# Patient Record
Sex: Female | Born: 1950 | Race: White | Hispanic: No | Marital: Married
Health system: Southern US, Academic
[De-identification: ages and names within clinical notes are randomized; demographics above are authoritative.]

## PROBLEM LIST (undated history)

## (undated) ENCOUNTER — Telehealth: Attending: Hematology & Oncology | Primary: Hematology & Oncology

## (undated) ENCOUNTER — Encounter: Attending: Hematology & Oncology | Primary: Hematology & Oncology

## (undated) ENCOUNTER — Telehealth

## (undated) ENCOUNTER — Telehealth: Attending: Surgical | Primary: Surgical

## (undated) ENCOUNTER — Encounter

## (undated) ENCOUNTER — Encounter: Payer: MEDICARE | Attending: Adult Health | Primary: Adult Health

## (undated) ENCOUNTER — Encounter: Attending: Radiation Oncology | Primary: Radiation Oncology

## (undated) ENCOUNTER — Ambulatory Visit: Payer: MEDICARE | Attending: Radiation Oncology | Primary: Radiation Oncology

## (undated) ENCOUNTER — Encounter: Attending: Adult Health | Primary: Adult Health

## (undated) ENCOUNTER — Ambulatory Visit: Payer: MEDICARE

## (undated) ENCOUNTER — Ambulatory Visit

## (undated) ENCOUNTER — Ambulatory Visit: Payer: MEDICARE | Attending: Adult Health | Primary: Adult Health

## (undated) ENCOUNTER — Encounter: Attending: Pharmacist | Primary: Pharmacist

## (undated) ENCOUNTER — Ambulatory Visit: Attending: Radiation Oncology | Primary: Radiation Oncology

## (undated) ENCOUNTER — Non-Acute Institutional Stay: Payer: MEDICARE

## (undated) ENCOUNTER — Encounter: Payer: MEDICARE | Attending: Radiation Oncology | Primary: Radiation Oncology

## (undated) DIAGNOSIS — M26609 Unspecified temporomandibular joint disorder, unspecified side: Secondary | ICD-10-CM

## (undated) MED ORDER — ASPIRIN 81 MG TABLET,DELAYED RELEASE: Freq: Every day | ORAL | 0 days

---
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
## 2000-04-15 ENCOUNTER — Encounter: Admission: RE | Admit: 2000-04-15 | Discharge: 2000-04-15 | Payer: Self-pay | Admitting: Family Medicine

## 2000-04-15 ENCOUNTER — Encounter: Payer: Self-pay | Admitting: Family Medicine

## 2000-05-12 ENCOUNTER — Encounter: Payer: Self-pay | Admitting: Surgery

## 2000-05-14 ENCOUNTER — Encounter: Payer: Self-pay | Admitting: Surgery

## 2000-05-14 ENCOUNTER — Encounter (INDEPENDENT_AMBULATORY_CARE_PROVIDER_SITE_OTHER): Payer: Self-pay

## 2000-05-14 ENCOUNTER — Ambulatory Visit (HOSPITAL_COMMUNITY): Admission: RE | Admit: 2000-05-14 | Discharge: 2000-05-15 | Payer: Self-pay | Admitting: Surgery

## 2001-04-01 ENCOUNTER — Encounter: Admission: RE | Admit: 2001-04-01 | Discharge: 2001-04-01 | Payer: Self-pay | Admitting: Family Medicine

## 2001-04-01 ENCOUNTER — Encounter: Payer: Self-pay | Admitting: Family Medicine

## 2002-07-05 ENCOUNTER — Encounter: Payer: Self-pay | Admitting: Family Medicine

## 2002-07-05 ENCOUNTER — Encounter: Admission: RE | Admit: 2002-07-05 | Discharge: 2002-07-05 | Payer: Self-pay | Admitting: Family Medicine

## 2002-09-06 ENCOUNTER — Ambulatory Visit (HOSPITAL_COMMUNITY): Admission: RE | Admit: 2002-09-06 | Discharge: 2002-09-06 | Payer: Self-pay | Admitting: Gastroenterology

## 2002-09-06 ENCOUNTER — Encounter (INDEPENDENT_AMBULATORY_CARE_PROVIDER_SITE_OTHER): Payer: Self-pay | Admitting: *Deleted

## 2002-09-18 ENCOUNTER — Encounter: Payer: Self-pay | Admitting: Gastroenterology

## 2002-09-18 ENCOUNTER — Ambulatory Visit (HOSPITAL_COMMUNITY): Admission: RE | Admit: 2002-09-18 | Discharge: 2002-09-18 | Payer: Self-pay | Admitting: Gastroenterology

## 2003-09-17 ENCOUNTER — Encounter: Admission: RE | Admit: 2003-09-17 | Discharge: 2003-09-17 | Payer: Self-pay | Admitting: Family Medicine

## 2003-09-17 ENCOUNTER — Encounter: Payer: Self-pay | Admitting: Family Medicine

## 2004-11-03 ENCOUNTER — Ambulatory Visit (HOSPITAL_COMMUNITY): Admission: RE | Admit: 2004-11-03 | Discharge: 2004-11-03 | Payer: Self-pay | Admitting: Family Medicine

## 2006-02-03 ENCOUNTER — Ambulatory Visit (HOSPITAL_COMMUNITY): Admission: RE | Admit: 2006-02-03 | Discharge: 2006-02-03 | Payer: Self-pay | Admitting: Family Medicine

## 2007-03-15 ENCOUNTER — Ambulatory Visit (HOSPITAL_COMMUNITY): Admission: RE | Admit: 2007-03-15 | Discharge: 2007-03-15 | Payer: Self-pay | Admitting: Family Medicine

## 2007-03-24 ENCOUNTER — Encounter: Admission: RE | Admit: 2007-03-24 | Discharge: 2007-03-24 | Payer: Self-pay | Admitting: Family Medicine

## 2008-03-15 ENCOUNTER — Encounter: Payer: Self-pay | Admitting: Gastroenterology

## 2008-03-26 ENCOUNTER — Ambulatory Visit (HOSPITAL_COMMUNITY): Admission: RE | Admit: 2008-03-26 | Discharge: 2008-03-26 | Payer: Self-pay | Admitting: Family Medicine

## 2008-03-27 ENCOUNTER — Encounter: Payer: Self-pay | Admitting: Gastroenterology

## 2008-06-26 ENCOUNTER — Encounter: Admission: RE | Admit: 2008-06-26 | Discharge: 2008-06-26 | Payer: Self-pay | Admitting: Family Medicine

## 2008-06-28 ENCOUNTER — Encounter: Admission: RE | Admit: 2008-06-28 | Discharge: 2008-06-28 | Payer: Self-pay | Admitting: Family Medicine

## 2009-03-15 ENCOUNTER — Encounter: Payer: Self-pay | Admitting: Gastroenterology

## 2009-03-27 ENCOUNTER — Ambulatory Visit (HOSPITAL_COMMUNITY): Admission: RE | Admit: 2009-03-27 | Discharge: 2009-03-27 | Payer: Self-pay | Admitting: Family Medicine

## 2009-04-08 DIAGNOSIS — K219 Gastro-esophageal reflux disease without esophagitis: Secondary | ICD-10-CM | POA: Insufficient documentation

## 2009-04-08 DIAGNOSIS — E785 Hyperlipidemia, unspecified: Secondary | ICD-10-CM | POA: Insufficient documentation

## 2009-04-08 DIAGNOSIS — R079 Chest pain, unspecified: Secondary | ICD-10-CM | POA: Insufficient documentation

## 2009-04-08 DIAGNOSIS — I10 Essential (primary) hypertension: Secondary | ICD-10-CM | POA: Insufficient documentation

## 2009-04-09 ENCOUNTER — Ambulatory Visit: Payer: Self-pay | Admitting: Gastroenterology

## 2009-04-09 DIAGNOSIS — R1084 Generalized abdominal pain: Secondary | ICD-10-CM | POA: Insufficient documentation

## 2009-04-09 DIAGNOSIS — N390 Urinary tract infection, site not specified: Secondary | ICD-10-CM | POA: Insufficient documentation

## 2009-04-09 DIAGNOSIS — K589 Irritable bowel syndrome without diarrhea: Secondary | ICD-10-CM | POA: Insufficient documentation

## 2009-04-09 LAB — CONVERTED CEMR LAB
BUN: 14 mg/dL (ref 6–23)
Basophils Absolute: 0 10*3/uL (ref 0.0–0.1)
Basophils Relative: 0.2 % (ref 0.0–3.0)
Bilirubin, Direct: 0.1 mg/dL (ref 0.0–0.3)
Creatinine, Ser: 0.6 mg/dL (ref 0.4–1.2)
Eosinophils Absolute: 0.1 10*3/uL (ref 0.0–0.7)
Folate: >20 ng/mL
GFR calc non Af Amer: 109.66 mL/min (ref >60)
Iron: 65 ug/dL (ref 42–145)
MCHC: 34.6 g/dL (ref 30.0–36.0)
MCV: 86.6 fL (ref 78.0–100.0)
Monocytes Absolute: 0.2 10*3/uL (ref 0.1–1.0)
Neutro Abs: 2.3 10*3/uL (ref 1.4–7.7)
Neutrophils Relative %: 54.4 % (ref 43.0–77.0)
Potassium: 4.5 meq/L (ref 3.5–5.1)
RBC: 4.47 M/uL (ref 3.87–5.11)
RDW: 11.9 % (ref 11.5–14.6)
TSH: 0.86 microintl units/mL (ref 0.35–5.50)
Total Bilirubin: 0.6 mg/dL (ref 0.3–1.2)
Vitamin B-12: 589 pg/mL (ref 211–911)

## 2009-04-17 ENCOUNTER — Ambulatory Visit: Payer: Self-pay | Admitting: Gastroenterology

## 2009-04-17 ENCOUNTER — Encounter: Payer: Self-pay | Admitting: Gastroenterology

## 2009-04-19 ENCOUNTER — Encounter: Payer: Self-pay | Admitting: Gastroenterology

## 2010-03-31 ENCOUNTER — Ambulatory Visit (HOSPITAL_COMMUNITY): Admission: RE | Admit: 2010-03-31 | Discharge: 2010-03-31 | Payer: Self-pay | Admitting: Family Medicine

## 2011-01-11 ENCOUNTER — Encounter: Payer: Self-pay | Admitting: Family Medicine

## 2011-03-17 ENCOUNTER — Other Ambulatory Visit (HOSPITAL_COMMUNITY): Payer: Self-pay | Admitting: Nurse Practitioner

## 2011-03-17 DIAGNOSIS — Z1231 Encounter for screening mammogram for malignant neoplasm of breast: Secondary | ICD-10-CM

## 2011-04-07 ENCOUNTER — Ambulatory Visit (HOSPITAL_COMMUNITY)
Admission: RE | Admit: 2011-04-07 | Discharge: 2011-04-07 | Disposition: A | Discharge status: Home / Self Care | Payer: 59 | Source: Ambulatory Visit | Attending: Nurse Practitioner | Admitting: Nurse Practitioner

## 2011-04-07 DIAGNOSIS — Z1231 Encounter for screening mammogram for malignant neoplasm of breast: Secondary | ICD-10-CM | POA: Insufficient documentation

## 2011-05-08 NOTE — Op Note (Signed)
NAME:  Andrea Mccann, RUDE                           ACCOUNT NO.:  [id number]   MEDICAL RECORD NO.:  [id number]                   PATIENT TYPE:  AMB   LOCATION:  ENDO                                 FACILITY:  [facility]   PHYSICIAN:  Charna Elizabeth, M.D.                   DATE OF BIRTH:  [birth year]   DATE OF PROCEDURE:  09/06/2002  DATE OF DISCHARGE:                                 OPERATIVE REPORT   PROCEDURE PERFORMED:  Esophagogastroduodenoscopy with biopsy.   ENDOSCOPIST:  Charna Elizabeth, M.D.   INSTRUMENT USED:  Olympus video panendoscope.   INDICATIONS FOR PROCEDURE:  Epigastric pain with nausea intermittently in a  60 year old white female who is status post laparoscopic cholecystectomy.  Rule out peptic ulcer disease, esophagitis, gastritis, etc.  The patient has  also had rectal bleeding intermittently.   PREPROCEDURE PREPARATION:  Informed consent was procured from the patient.  The patient fasted for eight hours prior to the procedure.   PREPROCEDURE PHYSICAL:  VITAL SIGNS:  The patient had stable vital signs.  NECK:  Supple.  CHEST:  Clear to auscultation.  HEART:  S1 and S2 regular.  ABDOMEN:  Soft with normal bowel sounds.   DESCRIPTION OF PROCEDURE:  The patient was placed in the left lateral  decubitus position and sedated with 70 mg of Demerol and 7 mg of Versed  intravenously.  Once the patient was adequately sedated and maintained on  low-flow oxygen and continuous cardiac monitoring, the Olympus video  panendoscope was advanced through the mouth piece, over the tongue, into the  esophagus under direct vision. The entire esophagus appeared normal with no  evidence of ring, stricture, masses, esophagitis, or Barrett's mucosa.  The  scope was then advanced into the stomach.   There was a large amount of debris seen in the proximal half of the stomach  along the greater curvature, indicating a possible element of gastroparesis.  On advancing the scope into the midbody and  the antrum, severe antral  gastritis was seen with erosion almost resembling a watermelon stomach.  Multiple biopsies were done to rule out the presence of H. pylori versus  possible ectasia.  The small bowel mucosa seemed very lipemic, but no  erosions, ulcerations, or masses were seen.  There was no evidence of  gastric outlet obstruction.  The patient tolerated the procedure well  without complications.   IMPRESSION:  1. Normal appearing esophagus.  2. Large amounts of debris on the greater curvature indicating possible     gastroparesis.  3. Antral erythema with minute erosions.  Biopsies were done as mentioned     above.  4. Lipemic appearing proximal small bowel mucosa.  No ulcerations or masses     seen.   RECOMMENDATIONS:  1. Await pathology results.  2. Proceed with a colonoscopy at this time.  3. Gastric emptying study to be scheduled.  4. Avoid all  nonsteroidals, including aspirin.  5. PPI of choice.  6. Treat with antibiotics if H. pylori present.  7. Outpatient followup in the next two weeks for further recommendations.                                               Charna Elizabeth, M.D.    JM/MEDQ  D:  09/06/2002  T:  09/07/2002  Job:  [id number]   cc:   Elizabeth Palau, N.P.   Bernadette Hoit, M.D.

## 2011-05-08 NOTE — Op Note (Signed)
   NAME:  Andrea Mccann, Andrea Mccann                           ACCOUNT NO.:  [id number]   MEDICAL RECORD NO.:  [id number]                   PATIENT TYPE:  AMB   LOCATION:  ENDO                                 FACILITY:  [facility]   PHYSICIAN:  Charna Elizabeth, M.D.                   DATE OF BIRTH:  19-Jan-1951   DATE OF PROCEDURE:  09/06/2002  DATE OF DISCHARGE:                                 OPERATIVE REPORT   PROCEDURE PERFORMED:  Colonoscopy.   ENDOSCOPIST:  Charna Elizabeth, M.D.   INSTRUMENT USED:  Olympus video colonoscope.   INDICATIONS FOR PROCEDURE:  Rectal bleeding in a 60 year old white female to  rule out colonic polyps, masses, hemorrhoids, etc.   PREPROCEDURE DIAGNOSIS:  Informed consent was procured from the patient.  The patient fasted for eight hours prior to the procedure and prepped with a  bottle of magnesium citrate and a gallon of NuLytely the night prior to the  procedure.   PREPROCEDURE PHYSICAL:  VITAL SIGNS:  The patient had stable vital signs.  NECK:  Supple.  CHEST:  Clear to auscultation.  HEART:  S1 and S2 regular.  ABDOMEN:  Soft with well-healed surgical scars present from previous  laparoscopic cholecystectomy.  Nontender with normal bowel sounds.  No  masses palpable.   DESCRIPTION OF PROCEDURE:  The patient was placed in the left lateral  decubitus position and sedated with an additional 30 mg of Demerol and 3 mg  of Versed intravenously.  Once the patient was adequately sedated and  maintained on low-flow oxygen and continuous cardiac monitoring, the Olympus  video colonoscope was advanced through the rectum to the cecum without  difficulty.  There were some residual stool in the colon.  Multiple washings  were done.  Small internal hemorrhoids were seen on retroflexion. The rest  of the colonic mucosa appeared healthy without lesions.  No masses, polyps,  erosions, ulcers, or diverticula were seen.   IMPRESSION:  Normal appearing colon up to the cecum except for  small  nonbleeding internal hemorrhoids.   RECOMMENDATIONS:  1. A high-fiber diet has been recommended with liberal fluid intake.  2.     Outpatient followup in the next two weeks for further recommendations.  3. Repeat colorectal cancer screening in the next ten years unless the     patient develops any abnormal symptoms in the interim.                                               Charna Elizabeth, M.D.    JM/MEDQ  D:  09/06/2002  T:  09/07/2002  Job:  [id number]   cc:   Bernadette Hoit, M.D.   Elizabeth Palau, N.P.

## 2011-05-08 NOTE — Op Note (Signed)
[place]  Patient:    Andrea Mccann, Andrea Mccann                        MRN: [id number] Proc. Date: 05/14/00 Adm. Date:  [id number] Disc. Date: [id number] Attending:  Katha Cabal CC:         Gloriajean Dell. Andrey Campanile, M.D.             Barbette Hair. Arlyce Dice, M.D. [facility]                           Operative Report  PREOPERATIVE DIAGNOSIS:  Gallstones.  POSTOPERATIVE DIAGNOSIS:  Gallstones with chronic cholecystitis.  OPERATION:  Laparoscopic cholecystectomy and intraoperative cholangiogram.  SURGEON:  Thornton Park. Daphine Deutscher, M.D.  ASSISTANT:  Gita Kudo, M.D.  INDICATION:  Andrea Mccann is a 60 year old lady who has had recurrent bouts of upper abdominal pain and small gallstones on ultrasound.  INFORMED CONSENT:  Preoperative informed consent was obtained regarding a laparoscopic cholecystectomy with complications not limited to bile duct injury and bleeding were discussed with her.  DESCRIPTION OF PROCEDURE:  Andrea Mccann was taken to OR #[id number] on Friday afternoon, May 14, 2000, and given general anesthesia.  The abdomen was prepped with Betadine and draped sterilely.  I made a longitudinal incision down into the umbilicus and through a purse-string suture passed the Hasson cannula.  The abdomen was insufflated and then three trocars were placed in the upper abdomen.  The gallbladder was grasped and was adherent to surrounding omentum with really dense adhesions. These were stripped away.  The infundibulum was identified and was dissected free and I got around the distal cystic duct.  I put a clip up on the gallbladder, then incised the cystic duct, and put a Reddick catheter in and did a dynamic cholangiogram showing a prompt filling of a fairly dilated common bile duct up in the intrahepatic region for no good reason and then prompt flow into the duodenum but with filling of apparent pancreatic duct above the sphincter choledochus. Good flow in the duodenum was  noted.  I then triple clipped the cystic duct, divided it, and I removed the gallbladder right along the gallbladder edge putting clips and using electrocautery to remove the gallbladder without difficulty.  Once detached, it was brought out through the umbilicus without entering it and without difficulty.  Purse-string suture was then used to reintroduce the Hasson.  I inspected the gallbladder bed and no bleeding or bile leaks were seen.  I then closed this by tying it down.  The wounds were irrigated and then checked with Marcaine. I then closed the wounds with 4-0 Vicryl, benzoin, and Steri-Strips.  The patient tolerated the procedure well and was taken to the recovery room in satisfactory condition. DD:  05/14/00 TD:  05/18/00 Job: [id number]

## 2012-04-18 ENCOUNTER — Other Ambulatory Visit (HOSPITAL_COMMUNITY): Payer: Self-pay | Admitting: Nurse Practitioner

## 2012-04-18 DIAGNOSIS — Z1231 Encounter for screening mammogram for malignant neoplasm of breast: Secondary | ICD-10-CM

## 2012-04-28 ENCOUNTER — Ambulatory Visit (HOSPITAL_COMMUNITY)
Admission: RE | Admit: 2012-04-28 | Discharge: 2012-04-28 | Disposition: A | Discharge status: Home / Self Care | Payer: 59 | Source: Ambulatory Visit | Attending: Nurse Practitioner | Admitting: Nurse Practitioner

## 2012-04-28 DIAGNOSIS — Z1231 Encounter for screening mammogram for malignant neoplasm of breast: Secondary | ICD-10-CM | POA: Insufficient documentation

## 2012-09-20 ENCOUNTER — Other Ambulatory Visit: Payer: Self-pay | Admitting: Family Medicine

## 2012-09-20 DIAGNOSIS — E8940 Asymptomatic postprocedural ovarian failure: Secondary | ICD-10-CM

## 2012-09-23 ENCOUNTER — Ambulatory Visit
Admission: RE | Admit: 2012-09-23 | Discharge: 2012-09-23 | Disposition: A | Discharge status: Home / Self Care | Payer: 59 | Source: Ambulatory Visit | Attending: Family Medicine | Admitting: Family Medicine

## 2012-09-23 DIAGNOSIS — M8580 Other specified disorders of bone density and structure, unspecified site: Secondary | ICD-10-CM

## 2012-09-23 DIAGNOSIS — E8940 Asymptomatic postprocedural ovarian failure: Secondary | ICD-10-CM

## 2013-05-12 ENCOUNTER — Other Ambulatory Visit (HOSPITAL_COMMUNITY): Payer: Self-pay | Admitting: Nurse Practitioner

## 2013-05-12 DIAGNOSIS — Z1231 Encounter for screening mammogram for malignant neoplasm of breast: Secondary | ICD-10-CM

## 2013-05-25 ENCOUNTER — Ambulatory Visit (HOSPITAL_COMMUNITY)
Admission: RE | Admit: 2013-05-25 | Discharge: 2013-05-25 | Disposition: A | Discharge status: Home / Self Care | Payer: 59 | Source: Ambulatory Visit | Attending: Nurse Practitioner | Admitting: Nurse Practitioner

## 2013-05-25 DIAGNOSIS — Z1231 Encounter for screening mammogram for malignant neoplasm of breast: Secondary | ICD-10-CM

## 2014-05-28 ENCOUNTER — Other Ambulatory Visit (HOSPITAL_COMMUNITY): Payer: Self-pay | Admitting: Nurse Practitioner

## 2014-05-28 DIAGNOSIS — Z1231 Encounter for screening mammogram for malignant neoplasm of breast: Secondary | ICD-10-CM

## 2014-06-05 ENCOUNTER — Ambulatory Visit (HOSPITAL_COMMUNITY): Payer: 59

## 2014-06-06 ENCOUNTER — Ambulatory Visit (HOSPITAL_COMMUNITY)
Admission: RE | Admit: 2014-06-06 | Discharge: 2014-06-06 | Disposition: A | Discharge status: Home / Self Care | Payer: 59 | Source: Ambulatory Visit | Attending: Nurse Practitioner | Admitting: Nurse Practitioner

## 2014-06-06 DIAGNOSIS — Z1231 Encounter for screening mammogram for malignant neoplasm of breast: Secondary | ICD-10-CM

## 2015-08-05 ENCOUNTER — Other Ambulatory Visit: Payer: Self-pay

## 2015-08-05 DIAGNOSIS — Z1231 Encounter for screening mammogram for malignant neoplasm of breast: Secondary | ICD-10-CM

## 2015-08-16 ENCOUNTER — Other Ambulatory Visit: Payer: Self-pay | Admitting: Nurse Practitioner

## 2015-08-16 DIAGNOSIS — Z1231 Encounter for screening mammogram for malignant neoplasm of breast: Secondary | ICD-10-CM

## 2015-08-16 DIAGNOSIS — Z780 Asymptomatic menopausal state: Secondary | ICD-10-CM

## 2015-08-27 ENCOUNTER — Ambulatory Visit
Admission: RE | Admit: 2015-08-27 | Discharge: 2015-08-27 | Disposition: A | Discharge status: Home / Self Care | Payer: 59 | Source: Ambulatory Visit

## 2015-08-27 DIAGNOSIS — Z1231 Encounter for screening mammogram for malignant neoplasm of breast: Secondary | ICD-10-CM

## 2015-09-30 ENCOUNTER — Other Ambulatory Visit: Payer: Self-pay

## 2015-10-08 ENCOUNTER — Inpatient Hospital Stay: Admission: RE | Admit: 2015-10-08 | Payer: Self-pay | Source: Ambulatory Visit

## 2015-10-15 ENCOUNTER — Other Ambulatory Visit: Payer: Self-pay

## 2015-11-12 ENCOUNTER — Ambulatory Visit
Admission: RE | Admit: 2015-11-12 | Discharge: 2015-11-12 | Disposition: A | Discharge status: Home / Self Care | Payer: 59 | Source: Ambulatory Visit | Attending: Nurse Practitioner | Admitting: Nurse Practitioner

## 2015-11-12 DIAGNOSIS — Z780 Asymptomatic menopausal state: Secondary | ICD-10-CM

## 2016-01-29 ENCOUNTER — Encounter: Payer: Self-pay | Admitting: Gastroenterology

## 2016-09-17 ENCOUNTER — Other Ambulatory Visit: Payer: Self-pay | Admitting: Nurse Practitioner

## 2016-09-17 DIAGNOSIS — Z1231 Encounter for screening mammogram for malignant neoplasm of breast: Secondary | ICD-10-CM

## 2016-09-29 ENCOUNTER — Ambulatory Visit: Payer: 59

## 2016-10-19 ENCOUNTER — Ambulatory Visit
Admission: RE | Admit: 2016-10-19 | Discharge: 2016-10-19 | Disposition: A | Discharge status: Home / Self Care | Payer: 59 | Source: Ambulatory Visit | Attending: Nurse Practitioner | Admitting: Nurse Practitioner

## 2016-10-19 DIAGNOSIS — Z1231 Encounter for screening mammogram for malignant neoplasm of breast: Secondary | ICD-10-CM

## 2016-10-21 ENCOUNTER — Other Ambulatory Visit: Payer: Self-pay | Admitting: Nurse Practitioner

## 2016-10-21 DIAGNOSIS — R928 Other abnormal and inconclusive findings on diagnostic imaging of breast: Secondary | ICD-10-CM

## 2016-10-27 ENCOUNTER — Ambulatory Visit
Admission: RE | Admit: 2016-10-27 | Discharge: 2016-10-27 | Disposition: A | Discharge status: Home / Self Care | Payer: 59 | Source: Ambulatory Visit | Attending: Nurse Practitioner | Admitting: Nurse Practitioner

## 2016-10-27 ENCOUNTER — Other Ambulatory Visit: Payer: Self-pay | Admitting: Nurse Practitioner

## 2016-10-27 DIAGNOSIS — R928 Other abnormal and inconclusive findings on diagnostic imaging of breast: Secondary | ICD-10-CM

## 2016-10-27 DIAGNOSIS — R229 Localized swelling, mass and lump, unspecified: Principal | ICD-10-CM

## 2016-10-27 DIAGNOSIS — IMO0002 Reserved for concepts with insufficient information to code with codable children: Secondary | ICD-10-CM

## 2016-11-02 ENCOUNTER — Ambulatory Visit
Admission: RE | Admit: 2016-11-02 | Discharge: 2016-11-02 | Disposition: A | Discharge status: Home / Self Care | Payer: 59 | Source: Ambulatory Visit | Attending: Nurse Practitioner | Admitting: Nurse Practitioner

## 2016-11-02 ENCOUNTER — Other Ambulatory Visit: Payer: Self-pay | Admitting: Nurse Practitioner

## 2016-11-02 DIAGNOSIS — R599 Enlarged lymph nodes, unspecified: Secondary | ICD-10-CM

## 2016-11-02 DIAGNOSIS — N6310 Unspecified lump in the right breast, unspecified quadrant: Secondary | ICD-10-CM

## 2016-11-02 DIAGNOSIS — IMO0002 Reserved for concepts with insufficient information to code with codable children: Secondary | ICD-10-CM

## 2016-11-02 DIAGNOSIS — R229 Localized swelling, mass and lump, unspecified: Principal | ICD-10-CM

## 2016-11-06 ENCOUNTER — Ambulatory Visit: Payer: Self-pay | Admitting: Surgery

## 2016-11-09 ENCOUNTER — Telehealth: Payer: Self-pay | Admitting: Oncology

## 2016-11-09 NOTE — Telephone Encounter (Signed)
Appt scheduled w/Magrinat for 11/22 at 4pm. Pt aware to arrive 30 minutes early. Demographics verified. Location given. Pt voiced understanding.

## 2016-11-10 ENCOUNTER — Telehealth: Payer: Self-pay | Admitting: *Deleted

## 2016-11-10 ENCOUNTER — Other Ambulatory Visit: Payer: Self-pay | Admitting: *Deleted

## 2016-11-10 ENCOUNTER — Encounter: Payer: Self-pay | Admitting: *Deleted

## 2016-11-10 NOTE — Telephone Encounter (Signed)
Spoke with patient to reschedule her appointment due to Dr. Darnelle CatalanMagrinat not here 11/22 afternoon.  Offered appt at 1230pm but patient states she is getting a [facility] 11/22 and will not be back in time.  Confirmed new appointment for 11/28 at 330 for lab and 4pm DR. Magrinat.  Discussed navigation resources.  Encouraged patient to call with any concerns or needs.  Contact information given.

## 2016-11-11 ENCOUNTER — Ambulatory Visit: Payer: 59 | Admitting: Oncology

## 2016-11-11 ENCOUNTER — Other Ambulatory Visit: Payer: 59

## 2016-11-13 ENCOUNTER — Telehealth: Payer: Self-pay

## 2016-11-13 NOTE — Telephone Encounter (Signed)
Pt met with team in [facility]. She will be starting chemo at [facility]. She is cancelling her appt here at [facility]. Done.

## 2016-11-16 ENCOUNTER — Telehealth: Payer: Self-pay | Admitting: *Deleted

## 2016-11-16 NOTE — Telephone Encounter (Signed)
Informed pt that we received her msg and her appts were cancelled at [facility]. [facility] is [facility] with Dr. Caswell CorwinAnders.

## 2016-11-17 ENCOUNTER — Other Ambulatory Visit: Payer: 59

## 2016-11-17 ENCOUNTER — Ambulatory Visit: Payer: 59 | Admitting: Oncology

## 2016-11-18 ENCOUNTER — Other Ambulatory Visit: Payer: Self-pay | Admitting: Oncology

## 2017-06-21 ENCOUNTER — Ambulatory Visit: Admission: RE | Admit: 2017-06-21 | Discharge: 2017-07-20 | Disposition: A | Discharge status: Home / Self Care

## 2017-06-21 ENCOUNTER — Ambulatory Visit
Admission: RE | Admit: 2017-06-21 | Discharge: 2017-07-20 | Disposition: A | Discharge status: Home / Self Care | Admitting: Radiation Oncology

## 2017-06-21 ENCOUNTER — Ambulatory Visit
Admission: RE | Admit: 2017-06-21 | Discharge: 2017-07-20 | Disposition: A | Discharge status: Home / Self Care | Payer: Commercial Managed Care - PPO

## 2017-06-21 DIAGNOSIS — Z510 Encounter for antineoplastic radiation therapy: Principal | ICD-10-CM

## 2017-06-24 DIAGNOSIS — C50911 Malignant neoplasm of unspecified site of right female breast: Principal | ICD-10-CM

## 2017-07-02 DIAGNOSIS — Z510 Encounter for antineoplastic radiation therapy: Principal | ICD-10-CM

## 2017-07-05 ENCOUNTER — Ambulatory Visit: Admission: RE | Admit: 2017-07-05 | Discharge: 2017-07-05 | Disposition: A | Discharge status: Home / Self Care

## 2017-07-05 ENCOUNTER — Ambulatory Visit
Admission: RE | Admit: 2017-07-05 | Discharge: 2017-07-05 | Disposition: A | Discharge status: Home / Self Care | Attending: Adult Health | Admitting: Adult Health

## 2017-07-05 ENCOUNTER — Ambulatory Visit
Admission: RE | Admit: 2017-07-05 | Discharge: 2017-07-05 | Disposition: A | Discharge status: Home / Self Care | Payer: Commercial Managed Care - PPO

## 2017-07-05 DIAGNOSIS — Z171 Estrogen receptor negative status [ER-]: Secondary | ICD-10-CM

## 2017-07-05 DIAGNOSIS — C50411 Malignant neoplasm of upper-outer quadrant of right female breast: Principal | ICD-10-CM

## 2017-07-08 DIAGNOSIS — C50411 Malignant neoplasm of upper-outer quadrant of right female breast: Principal | ICD-10-CM

## 2017-07-08 DIAGNOSIS — Z171 Estrogen receptor negative status [ER-]: Secondary | ICD-10-CM

## 2017-07-12 ENCOUNTER — Ambulatory Visit: Admission: RE | Admit: 2017-07-12 | Discharge: 2017-07-12 | Disposition: A | Discharge status: Home / Self Care

## 2017-07-12 DIAGNOSIS — C50411 Malignant neoplasm of upper-outer quadrant of right female breast: Principal | ICD-10-CM

## 2017-07-12 DIAGNOSIS — Z171 Estrogen receptor negative status [ER-]: Secondary | ICD-10-CM

## 2017-07-14 DIAGNOSIS — C50411 Malignant neoplasm of upper-outer quadrant of right female breast: Principal | ICD-10-CM

## 2017-07-14 DIAGNOSIS — Z510 Encounter for antineoplastic radiation therapy: Principal | ICD-10-CM

## 2017-07-14 DIAGNOSIS — Z171 Estrogen receptor negative status [ER-]: Secondary | ICD-10-CM

## 2017-07-21 ENCOUNTER — Ambulatory Visit
Admission: RE | Admit: 2017-07-21 | Discharge: 2017-08-20 | Disposition: A | Discharge status: Home / Self Care | Admitting: Radiation Oncology

## 2017-07-21 ENCOUNTER — Ambulatory Visit
Admission: RE | Admit: 2017-07-21 | Discharge: 2017-08-20 | Disposition: A | Discharge status: Home / Self Care | Payer: Commercial Managed Care - PPO

## 2017-07-21 ENCOUNTER — Ambulatory Visit
Admission: RE | Admit: 2017-07-21 | Discharge: 2017-08-20 | Disposition: A | Discharge status: Home / Self Care | Payer: Commercial Managed Care - PPO | Attending: Radiation Oncology | Admitting: Radiation Oncology

## 2017-07-21 ENCOUNTER — Ambulatory Visit
Admission: RE | Admit: 2017-07-21 | Discharge: 2017-08-20 | Disposition: A | Discharge status: Home / Self Care | Payer: Commercial Managed Care - PPO | Attending: Adult Health | Admitting: Adult Health

## 2017-07-21 ENCOUNTER — Ambulatory Visit
Admission: RE | Admit: 2017-07-21 | Discharge: 2017-08-20 | Disposition: A | Discharge status: Home / Self Care | Attending: Radiation Oncology | Admitting: Radiation Oncology

## 2017-07-21 DIAGNOSIS — C50411 Malignant neoplasm of upper-outer quadrant of right female breast: Secondary | ICD-10-CM

## 2017-07-21 DIAGNOSIS — Z510 Encounter for antineoplastic radiation therapy: Principal | ICD-10-CM

## 2017-07-22 DIAGNOSIS — C50411 Malignant neoplasm of upper-outer quadrant of right female breast: Principal | ICD-10-CM

## 2017-07-22 DIAGNOSIS — Z171 Estrogen receptor negative status [ER-]: Secondary | ICD-10-CM

## 2017-07-29 DIAGNOSIS — C50911 Malignant neoplasm of unspecified site of right female breast: Principal | ICD-10-CM

## 2017-07-29 DIAGNOSIS — Z171 Estrogen receptor negative status [ER-]: Secondary | ICD-10-CM

## 2017-07-29 DIAGNOSIS — C50411 Malignant neoplasm of upper-outer quadrant of right female breast: Principal | ICD-10-CM

## 2017-08-04 ENCOUNTER — Ambulatory Visit
Admission: RE | Admit: 2017-08-04 | Discharge: 2017-08-04 | Disposition: A | Discharge status: Home / Self Care | Payer: Commercial Managed Care - PPO

## 2017-08-04 DIAGNOSIS — C50911 Malignant neoplasm of unspecified site of right female breast: Principal | ICD-10-CM

## 2017-08-04 DIAGNOSIS — Z171 Estrogen receptor negative status [ER-]: Secondary | ICD-10-CM

## 2017-08-05 DIAGNOSIS — Z171 Estrogen receptor negative status [ER-]: Secondary | ICD-10-CM

## 2017-08-05 DIAGNOSIS — C50411 Malignant neoplasm of upper-outer quadrant of right female breast: Principal | ICD-10-CM

## 2017-08-06 DIAGNOSIS — Z510 Encounter for antineoplastic radiation therapy: Principal | ICD-10-CM

## 2017-08-06 DIAGNOSIS — C50411 Malignant neoplasm of upper-outer quadrant of right female breast: Secondary | ICD-10-CM

## 2017-08-09 DIAGNOSIS — C50411 Malignant neoplasm of upper-outer quadrant of right female breast: Secondary | ICD-10-CM

## 2017-08-09 DIAGNOSIS — Z510 Encounter for antineoplastic radiation therapy: Principal | ICD-10-CM

## 2017-08-12 DIAGNOSIS — Z171 Estrogen receptor negative status [ER-]: Secondary | ICD-10-CM

## 2017-08-12 DIAGNOSIS — C50411 Malignant neoplasm of upper-outer quadrant of right female breast: Principal | ICD-10-CM

## 2017-08-19 ENCOUNTER — Ambulatory Visit
Admission: RE | Admit: 2017-08-19 | Discharge: 2017-08-19 | Disposition: A | Discharge status: Home / Self Care | Payer: Commercial Managed Care - PPO

## 2017-08-19 ENCOUNTER — Ambulatory Visit
Admission: RE | Admit: 2017-08-19 | Discharge: 2017-08-19 | Disposition: A | Discharge status: Home / Self Care | Payer: Commercial Managed Care - PPO | Attending: Adult Health | Admitting: Adult Health

## 2017-08-19 DIAGNOSIS — Z170 Estrogen receptor positive status [ER+]: Secondary | ICD-10-CM

## 2017-08-19 DIAGNOSIS — Z171 Estrogen receptor negative status [ER-]: Secondary | ICD-10-CM

## 2017-08-19 DIAGNOSIS — C50911 Malignant neoplasm of unspecified site of right female breast: Principal | ICD-10-CM

## 2017-08-19 MED ORDER — XELODA 500 MG TABLET
ORAL_TABLET | 7 refills | 0 days
Start: 2017-08-19 — End: 2017-08-19

## 2017-08-19 MED ORDER — CAPECITABINE 500 MG TABLET
ORAL_TABLET | Freq: Two times a day (BID) | ORAL | 7 refills | 0.00000 days | Status: CP
Start: 2017-08-19 — End: 2017-10-12

## 2017-08-20 NOTE — Unmapped (Signed)
Arrangements have been made for Ms. Dilday to receive her first delivery of capecitabine on 08/26/2017 from the [facility].  The plan is for her to start the medication on 09/02/17.      Initiation of Therapy Assessment:   -Complete medication list reviewed.  No issues were identified  -Past medical history reviewed.  -Allergies were reviewed  -Relevant cultural assessment and health literacy was completed.  Patient is able to store his/her medication as directed  -This patient does not have any cognitive or physical disabilities   -This patient does not speak any other languages.   - Shipping address was verified  - Copay and delivery date confirmed with patient     The following was explained to the patient:  Advised patient of the following:  -A Welcome packet will be sent to the patient   -Assignment of Benefit for the patient to review and return before next refill  -Copay or out-of-pocket responsibility  -Arrangement of payment method can be done by contacting the pharmacy  -Take medications with during travel, have doctor's appointments, or if being admitted to the hospital.  Advised patient of refill order process:  -Pharmacy must speak to the patient every month to receive medication  -Patient may call the pharmacy, or the pharmacy will call about a week before the refill is due    Patient verbalized understanding of the above information.

## 2017-08-20 NOTE — Unmapped (Signed)
Per test claim for CAPECITABINE at the [facility], Xeloda is [employer]'s preferred drug COVERED FOR $0.

## 2017-08-20 NOTE — Unmapped (Signed)
Breast Cancer Return Patient Evaluation    Referring Physician: [name], Md  [address]  [id number] Physician Office Building  [place], [place] [address]. [phone]    PCP: Diana Birmingham, NP    Reason for Visit: A 66 y.o. 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 Pleasant woman with overall plan of care: Neoadjuvant paclitaxel on [facility] 030, surgery, adjuvant chemotherapy informed by response and radiation. She completed 12 weeks paclitaxel 02/18/2017 with clinical and radiographic response in both breast and axilla and had breast conserving surgery / ALND 03/18/2017, which revealed ypT1 (1.2cm) N3 (14 involved LN) residual disease. Per the treating surgeon the nodal disease was intermixed with a nerve/vessel bundle and was challenging to dissect.  No evidence distant mets.     Overall plan of care: dose-dense AC, RT/weekly carbo, and then capecitabine according to CREATE-X principles. We have discussed the lower level of evidence for the platinum, which has fewer [facility] data to support it but is reasonable in the high risk setting.     Systemic therapy  - She completed dose-dense AC  - She has completed XRT.  Original plan for radiosensitizing carboplatin. She received the first dose of carboplatin on 07/05/17, subsequently [employer] denied the carboplatin. A peer-to-peer was held in which the [place] MD presented their policy of only accepting NCCN guideline-concordant therapy. Given the absence of the levels of evidence for carboplatin as a radiosensitizing agent in this setting it was denied.  Carboplatin is most likely the least important of the interventions and while justifiable on the basis of the high risk nature of the tumor and the need for RNI, it is without the kind of level I evidence required.   - to start CREATE-X style Xeloda in 2 weeks once skin is completely healed from radiation.  Teaching done today by Diana David, PharmD, see her separate note for details.      Back pain:  Ongoing for 2 months, not improving with conservative measures. Pain is worst with sudden movements, hurts now mostly on the spine. Xrays and bone scan are negative however considering her symptoms and high risk for recurrence will plan for thoracic spine MRI to rule out paraspinal mass.     Genetics. She had sequencing and deletion/duplication analysis of four genes: BRCA1, BRCA2, PALB2,  and TP53. The testing was performed at [facility] and did not identify any genetic changes associated with cancer predisposition. Monitor.     Dispo: for thoracic spine MRI, then start Xeloda in 2 weeks and return for eval in 5 weeks.      -----------------------------------------------------------------    Interval Social / Medical Hx:  Here today unaccompanied  - finished XRT last Friday  - 7 week hx of right sided back pain - mid thoracic region and radiating to the right.  She has been taking Aleve BID for several weeks, it helps but the pain comes back near the end of the dose.  Keeps her up at night at times.  Hurst with coughing or changing position.    - her husband is wondering if they can pay for the carboplatin since insurance denied it    Oncology History    2017: R TNBC.    Home treatment team Diana David, [place], [place]:  She has been referred to medical oncology, radiation oncology.  Discussion with surgery recommended neoadjuvant chemotherapy.  Malignant neoplasm of upper-outer quadrant of right breast in female, estrogen receptor negative (CMS-HCC)    09/2016 -  Presenting Symptoms     R breast mass palpated by her husband the day before her scheduled annual screening mammogram. MMG/US showed a right breast UOQ mass measuring 1.9cm. Adenopathy was present in the R axilla.         11/02/2016 Biopsy     Right breast 10:00 core biopsy: IDC, gr 3, ER-(0), PR-(0), HER2 negative by FISH. Ki67: 40%. Right axillary core biopsy: +LN metastasis         11/10/2016 Initial Diagnosis     Malignant neoplasm of upper-outer quadrant of right breast in female, estrogen receptor negative (RAF-HCC)       11/11/2016 Tumor Board     MDC recs: cT2 N1 TN IDC. She is LN positive. Needs R add view MMG. Needs L MMG. Trial considerations: [facility] 030. If does NACT, would be a TAD surgical trial candidate. Potential BCT candidate. Meeting surgery, med/onc, rad/onc today.          12/03/2016 - 02/18/2017 Chemotherapy     Chemotherapy Treatment    Treatment Goal Curative   Line of Treatment [No plan line of treatment]   Plan Name STUDY [facility]-030 IRB# [id number] ARM B: PACLItaxel (v. 08/03/16)   Start Date 12/03/2016   End Date 02/18/2017 (Planned)   Provider Diana John, MD   Chemotherapy dexamethasone (DECADRON) tablet 20 mg, 20 mg, Oral, Once, 1 of 4 cycles    PACLItaxel (TAXOL) 144.78 mg in sodium chloride 0.9% NON-PVC (NS) 250 mL IVPB, 80 mg/m2 = 144.78 mg, Intravenous, Once, 1 of 4 cycles              03/04/2017 Interval Scan(s)     Post chemo MMG/US at [facility]: 5 axillary lymph nodes measuring between 0.4 and 1.4 cm are identified. The largest has a normal-appearing fatty hilum. The axillary lymph node with the previously placed spring clip is the lowest (in the longitudinal direction) level 1 axillary lymph node imaged (annotated as the last lymph node imaged for the benefit of future SAVI SCOUT placement). This clipped lymph node measures 0.7 x 0.6 cm which corresponds to the mammographic size. The index mass in the far lateral 2:00 position right breast measures 1.7 x 0.8 cm and has an associated biopsy clip, corresponding to the mammographic image.    SAVI scout placed into 2 LNs.         03/18/2017 Surgery     Right breast savi scout partial mastectomy with ALND and ARM. She participated in the TAD protocol by Dr. [name].   Path: IDC, G3, 1.25 cm, triple negative. DCIS present. Superior margin positive for DCIS, TAD node positive (1/1), total 14/24 positive LN, largest met 11 mm. No LVI, No ECE. RCB 3.611 (RCB-III).  Stage IIIC (ypT1c ypN3a)         04/26/2017 -  Chemotherapy     Chemotherapy Treatment    Treatment Goal Curative   Line of Treatment [No plan line of treatment]   Plan Name STUDY [facility]-030 IRB# [id number] ARM B: PACLItaxel (v. 08/03/16)   Start Date 12/03/2016   End Date 02/18/2017   Provider Diana John, MD   Chemotherapy dexamethasone (DECADRON) tablet 20 mg, 20 mg, Oral, Once, 4 of 4 cycles    PACLItaxel (TAXOL) 144.78 mg in sodium chloride 0.9% NON-PVC (NS) 250 mL IVPB, 80 mg/m2 = 144.78 mg, Intravenous, Once, 4 of 4 cycles  Chemotherapy Treatment    Treatment Goal Curative   Line of Treatment [No plan line of treatment]   Plan Name OP BREAST AC (DOSE DENSE)   Start Date 04/26/2017   End Date 06/07/2017 (Planned)   Provider Diana John, MD   Chemotherapy dexamethasone (DECADRON) tablet 12 mg, 12 mg, Oral, Once, 1 of 4 cycles    cyclophosphamide (CYTOXAN) 1,092 mg in sodium chloride (NS) 0.9% 250 mL IVPB, 600 mg/m2 = 1,092 mg, Intravenous, Once, 1 of 4 cycles    DOXOrubicin (ADRIAMYCIN) syringe 109.2 mg, 60 mg/m2 = 109.2 mg, Intravenous, Once, 1 of 4 cycles               Past Medical History:   Diagnosis Date   ??? Back pain    ??? Breast cancer (CMS-HCC) 10/2016    right   ??? GERD (gastroesophageal reflux disease)    ??? Hemorrhoids    ??? High blood pressure    ??? Melanoma (CMS-HCC)      Past Surgical History:   Procedure Laterality Date   ??? BREAST BIOPSY Right 10/2016    ca   ??? CHEMOTHERAPY      2017-2018   ??? CHOLECYSTECTOMY     ??? IR INSERT PORT AGE GREATER THAN 5 YRS  11/27/2016    IR INSERT PORT AGE GREATER THAN 5 YRS 11/27/2016 Ammie Dalton, MD IMG VIR MM MMNT   ??? PR INTRAOPERATIVE SENTINEL LYMPH NODE ID W DYE INJECTION Right 03/18/2017    Procedure: INTRAOPERATIVE IDENTIFICATION SENTINEL LYMPH NODE(S) INCLUDE INJECTION NON-RADIOACTIVE DYE, WHEN PERFORMED;  Surgeon: Talbert Cage, DO;  Location: ASC OR [facility];  Service: Surgical Oncology   ??? PR MASTECTOMY, PARTIAL Right 03/18/2017 Procedure: MASTECTOMY, PARTIAL (EG, LUMPECTOMY, TYLECTOMY, QUADRANTECTOMY, SEGMENTECTOMY);  Surgeon: Talbert Cage, DO;  Location: ASC OR [facility];  Service: Surgical Oncology   ??? PR REMOVE ARMPITS LYMPH NODES COMPLT Right 03/18/2017    Procedure: AXILLARY LYMPHADENECTOMY; COMPLETE;  Surgeon: Talbert Cage, DO;  Location: ASC OR [facility];  Service: Surgical Oncology   ??? TONSILLECTOMY       Current Outpatient Prescriptions   Medication Sig Dispense Refill   ??? amLODIPine-valsartan (EXFORGE) 5-160 mg per tablet Take 1 tablet by mouth daily.      ??? atorvastatin (LIPITOR) 20 MG tablet Take 20 mg by mouth daily.     ??? calcium carbonate-vit D3-min 600 mg calcium- 400 unit Tab Take by mouth.     ??? ibandronate (BONIVA) 150 mg tablet Take 150 mg by mouth.     ??? RABEprazole (ACIPHEX) 20 mg tablet Take 1 tablet by mouth  daily     ??? capecitabine (XELODA) 500 MG tablet Take 4 tablets (2,000 mg total) by mouth Two (2) times a day. For 14 days then 7 days off 112 tablet 7   ??? clindamycin (CLEOCIN T) 1 % external solution Apply to affected area 2 times daily (Patient not taking: Reported on 07/29/2017) 60 mL 2   ??? dexamethasone (DECADRON) 4 MG tablet Take 2 tabs in the AM with food on days 2,3,4 of each chemo cycle (Patient not taking: Reported on 07/29/2017) 24 tablet 0   ??? docusate sodium (COLACE) 100 MG capsule Take 100 mg by mouth daily.     ??? LORazepam (ATIVAN) 0.5 MG tablet 1 tablet every 8 hours as needed for insomnia or nausea during chemo (Patient not taking: Reported on 07/29/2017) 15 tablet 0   ??? naproxen sodium (ALEVE) 220 MG tablet Take 220 mg by  mouth 2 (two) times a day with meals.     ??? ondansetron (ZOFRAN) 8 MG tablet Take 1 tablet (8 mg total) by mouth every eight (8) hours as needed for nausea. (Patient not taking: Reported on 08/19/2017) 30 tablet 2     No current facility-administered medications for this visit.      Facility-Administered Medications Ordered in Other Visits   Medication Dose Route Frequency Provider Last Rate Last Dose   ??? heparin, porcine (PF) injection 500 Units  500 Units Intravenous Q MWF Ammie Dalton, MD       ??? 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 Has two sons, one lives in [place] the other in [place].      Family History   Problem Relation Age of Onset   ??? Breast cancer Paternal Grandmother         age unknown   ??? Ovarian cancer Cousin         maternal- her mother was on rx during preg.    ??? No Known Problems Mother    ??? No Known Problems Father    ??? No Known Problems Sister    ??? No Known Problems Daughter    ??? No Known Problems Maternal Grandmother    ??? No Known Problems Maternal Grandfather    ??? No Known Problems Paternal Grandfather    ??? Colon cancer Neg Hx    ??? Endometrial cancer Neg Hx    ??? BRCA 1/2 Neg Hx    ??? Cancer Neg Hx      Review of Systems: A 12-system review of systems was obtained including: Constitutional, Eyes, ENT, Cardiovascular, Respiratory, GI, GU, Musculoskeletal, Skin, Neurological, Psychiatric, Endocrine, Heme/Lymphatic, and Allergic/Immunologic systems. It is negative or non-contributory to the patient???s management except as detailed in Interval Hx.  For details see the Symptom Report Form (MIM#1170), which I have reviewed, signed and will be scanned into the record.  PS = 0    Physical Examination:  Vital Signs: BP 122/69  - Pulse 80  - Temp 36.5 ??C (97.7 ??F) (Oral)  - Resp 18  - Ht 165.1 cm (5' 5)  - Wt 73.3 kg (161 lb 11.2 oz)  - SpO2 99%  - BMI 26.91 kg/m??      General:  Healthy-appearing female in no acute distress.  HEENT: no erythema  Cardiovasc:  No heaves, regular, no additional sounds. No lower extremity edema.    Respiratory:  Chest clear to percussion and auscultation, unlabored  Gastrointestinal:  Soft, nontender, no hepatomegaly.   Musculoskeletal:  Pain to palpation mid-thoracic spine and just to the right - at times radiates across her back  Skin and Subcutaneous Tissues:  Rash resolved on her scalp  Psychiatric: Mood is normal.  No other symptoms.   Neuro:  Alert and oriented.  Gait and coordination normal  Upper Extremity Lymphedema: None.   Breast/Chest: healed incision right breast and axilla. Radiation skin changes noted with some desquamation noted right axillary region and upper outer quadrant region. Left breast ok. Port left CW ok.  Heme/Lymphatic/Immunologic: No supraclavicular or axillary adenopathy    I have personally reviewed the following diagnostic studies:    Labs and pathology reviewed. See Results  for details

## 2017-08-20 NOTE — Unmapped (Signed)
RADIATION ONCOLOGY TREATMENT COMPLETION NOTE    Encounter Date: 08/20/2017  Patient Name: Diana David  Medical Record Number: [id number]    Referring Physician: No referring provider defined for this encounter.    Primary Care Provider: [name], NP    DIAGNOSIS:  Breast cancer    TREATMENT INTENT: curative    CLINICAL TRIAL: No RT trial    CHEMOTHERAPY: administered before radiation    RADIATION TREATMENT SUMMARY:       66 y/o woman with a cT2N1 right breast IDC, ER-/PR-/Her2-, post neoadjuvant Taxol on [facility] 030, lumpectomy with ALND for breast conserving surgery / ALND 03/18/2017, which revealed ypT1 (1.2cm) N3 (14 involved LN) residual disease. Per the treating surgeon the nodal disease was intermixed with a nerve/vessel bundle and was challenging to dissect.  No evidence distant mets.      Plan now is RT/weekly [place], and then capecitabine according to CREATE-X principles    RT breast and nodes, 50 Gy + 10 Gy boost         Treatment site Treatment Technique/Modality Energy Dose per fraction Total number  of fractions Total dose Start date End date   R Nuevo fossa   Obliques 6/15 MV 200 cGy 25 5000 cGy 07/05/17   08/06/17   R breast  FIF tangents 6/15 MV 200 cGy 25 5000 cGy 07/05/17 08/06/17   R breast boost Electrons 15 MeV 200 cGy 5 1000 cGy 08/09/17 08/13/17     COMPLETED INTENDED COURSE:  yes    TREATMENT BREAK > 2 WEEKS:  no    TOLERANCE TO TREATMENT:  Moderate toxicities or complications requiring outpatient intervention(s)- skin cream    PLAN FOR FOLLOW-UP: Vincent Peyer is to return for follow up with me/our group in 6 mos with MMG, Dr. Iona Hansen in the Fall.  No endocrine therapy.

## 2017-08-24 MED FILL — XELODA/500MG/TAB: XELODA/500MG/TAB | 21 days supply | Qty: 112 | Fill #0

## 2017-09-08 NOTE — Unmapped (Signed)
[facility] Pharmacy Refill Coordination Note  Specialty Medication(s): XELODA 500      Diana David, DOB: 1951-09-26  Phone: [phone] (home) , Alternate phone contact: N/A  Phone or address changes today?: No  All above HIPAA information was verified with patient.  Shipping Address: [address]  [place] [address]   Insurance changes? No    Completed refill call assessment today to schedule patient's medication shipment from the [facility] [phone]).      Confirmed the medication and dosage are correct and have not changed: Yes, regimen is correct and unchanged.    Confirmed patient started or stopped the following medications in the past month:  No, there are no changes reported at this time.    Are you tolerating your medication?:  Diana David reports tolerating the medication.    ADHERENCE      Did you miss any doses in the past 4 weeks? No missed doses reported.    FINANCIAL/SHIPPING    Delivery Scheduled: Yes, Expected medication delivery date: 09/14/17     Diana David did not have any additional questions at this time.    Delivery address validated in FSI scheduling system: Yes, address listed in FSI is correct.    We will follow up with patient monthly for standard refill processing and delivery.      Thank you,  Westley Gambles   [facility] Specialty Technician

## 2017-09-10 ENCOUNTER — Ambulatory Visit
Admission: RE | Admit: 2017-09-10 | Discharge: 2017-09-10 | Disposition: A | Discharge status: Home / Self Care | Payer: Commercial Managed Care - PPO

## 2017-09-10 DIAGNOSIS — C50911 Malignant neoplasm of unspecified site of right female breast: Principal | ICD-10-CM

## 2017-09-10 DIAGNOSIS — Z170 Estrogen receptor positive status [ER+]: Secondary | ICD-10-CM

## 2017-09-13 MED FILL — XELODA/500MG/TAB: XELODA/500MG/TAB | 21 days supply | Qty: 112 | Fill #1

## 2017-09-13 NOTE — Unmapped (Signed)
Pharmacist Oral Chemotherapy Follow-Up    Diana David is a 66 y.o. female with triple negative breast cancer who I am following while on oral chemotherapy.    Oral chemotherapy regimen: Capecitabine 2000mg  BID x 14 days then 7 days off for 8 cycles  Tentative Start Date: ??09/02/2017   Pharmacy: [facility]     Adherence: No missed doses.  Diana David called me with concern that she may have accidentally taken an extra dose in the past week.  She is not sure if she took an extra dose but she has 4 less tablets.  During the first week of capecitabine she separated each dose in a sealed bag with the date and time she is supposed to take it.  She then read some instructions that stated that one should not store the medication outside of its original container.  She then used a calendar to track her doses but found that it doesn't work as well for her.    Side effects: She denies any adverse effects of capecitabine since starting the medication.    Drug Interactions: No new medications for review.    Assessment/Plan: Diana David is tolerating capecitabine well.  To remain adherent, I recommended that she use the sealed bags rather than use the calendar.  She will complete her current cycle with 4 less tablets on the last day.

## 2017-10-04 NOTE — Unmapped (Signed)
[facility] Specialty Pharmacy Refill Coordination Note  Specialty Medication(s): Xeloda  Additional Medications shipped: none    Diana David, DOB: [birth year]  Phone: [phone] (home) , Alternate phone contact: N/A  Phone or address changes today?: No  All above HIPAA information was verified with patient.  Shipping Address: [address]  [place] [address]   Insurance changes? No    Completed refill call assessment today to schedule patient's medication shipment from the [facility] [phone]).      Confirmed the medication and dosage are correct and have not changed: Yes, regimen is correct and unchanged.    Confirmed patient started or stopped the following medications in the past month:  No, there are no changes reported at this time.    Are you tolerating your medication?:  Diana David reports tolerating the medication.    ADHERENCE      Did you miss any doses in the past 4 weeks? No missed doses reported.    FINANCIAL/SHIPPING    Delivery Scheduled: Yes, Expected medication delivery date: 10/12/17     Telisa did not have any additional questions at this time.    Delivery address validated in FSI scheduling system: Yes, address listed in FSI is correct.    We will follow up with patient monthly for standard refill processing and delivery.      Thank you,  Rollen Sox   [facility] Specialty Pharmacist

## 2017-10-12 MED ORDER — CAPECITABINE 500 MG TABLET
ORAL_TABLET | Freq: Two times a day (BID) | ORAL | 5 refills | 0.00000 days | Status: CP
Start: 2017-10-12 — End: 2017-12-11

## 2017-10-12 NOTE — Unmapped (Signed)
Diana David must now obtain capecitabine from [facility] specialty pharmacy.  Prescription sent to [facility].

## 2017-10-13 MED FILL — XELODA/500MG/TAB: XELODA/500MG/TAB | 21 days supply | Qty: 112 | Fill #2

## 2017-10-13 NOTE — Unmapped (Signed)
Had troubles processing RX through insurance. Insurance called today with an override. Spoke with patient and she needs by in the morning (10/14/17.) She will be at cancer center at this time and needs morning dose. Therefore, sending Xeloda via courier to [facility] for her to pick up there at 7am on 10/14/17. Messaged A [place] to make her aware.

## 2017-10-14 ENCOUNTER — Ambulatory Visit
Admission: RE | Admit: 2017-10-14 | Discharge: 2017-10-14 | Disposition: A | Discharge status: Home / Self Care | Payer: Commercial Managed Care - PPO

## 2017-10-14 ENCOUNTER — Ambulatory Visit
Admission: RE | Admit: 2017-10-14 | Discharge: 2017-10-14 | Disposition: A | Discharge status: Home / Self Care | Payer: Commercial Managed Care - PPO | Attending: Adult Health | Admitting: Adult Health

## 2017-10-14 DIAGNOSIS — C50919 Malignant neoplasm of unspecified site of unspecified female breast: Principal | ICD-10-CM

## 2017-10-14 DIAGNOSIS — Z171 Estrogen receptor negative status [ER-]: Secondary | ICD-10-CM

## 2017-10-14 DIAGNOSIS — C50411 Malignant neoplasm of upper-outer quadrant of right female breast: Principal | ICD-10-CM

## 2017-10-14 LAB — CBC W/ AUTO DIFF
BASOPHILS ABSOLUTE COUNT: 0 10*9/L (ref 0.0–0.1)
EOSINOPHILS ABSOLUTE COUNT: 0.2 10*9/L (ref 0.0–0.4)
HEMOGLOBIN: 11.5 g/dL — ABNORMAL LOW (ref 12.0–16.0)
LARGE UNSTAINED CELLS: 6 % — ABNORMAL HIGH (ref 0–4)
LYMPHOCYTES ABSOLUTE COUNT: 0.5 10*9/L — ABNORMAL LOW (ref 1.5–5.0)
MEAN CORPUSCULAR HEMOGLOBIN CONC: 33.6 g/dL (ref 31.0–37.0)
MEAN CORPUSCULAR HEMOGLOBIN: 31.3 pg (ref 26.0–34.0)
MEAN CORPUSCULAR VOLUME: 93.4 fL (ref 80.0–100.0)
MEAN PLATELET VOLUME: 6.8 fL — ABNORMAL LOW (ref 7.0–10.0)
MONOCYTES ABSOLUTE COUNT: 0.4 10*9/L (ref 0.2–0.8)
NEUTROPHILS ABSOLUTE COUNT: 1.8 10*9/L — ABNORMAL LOW (ref 2.0–7.5)
PLATELET COUNT: 268 10*9/L (ref 150–440)
RED BLOOD CELL COUNT: 3.67 10*12/L — ABNORMAL LOW (ref 4.00–5.20)

## 2017-10-14 LAB — COMPREHENSIVE METABOLIC PANEL
ALBUMIN: 3.9 g/dL (ref 3.5–5.0)
ALT (SGPT): 34 U/L (ref 15–48)
ANION GAP: 9 mmol/L (ref 9–15)
AST (SGOT): 28 U/L (ref 14–38)
BILIRUBIN TOTAL: 0.8 mg/dL (ref 0.0–1.2)
BLOOD UREA NITROGEN: 15 mg/dL (ref 7–21)
BUN / CREAT RATIO: 28
CALCIUM: 9.1 mg/dL (ref 8.5–10.2)
CHLORIDE: 100 mmol/L (ref 98–107)
CO2: 29 mmol/L (ref 22.0–30.0)
EGFR MDRD AF AMER: >=60 mL/min/{1.73_m2} (ref >=60)
EGFR MDRD NON AF AMER: >=60 mL/min/{1.73_m2} (ref >=60)
GLUCOSE RANDOM: 108 mg/dL (ref 65–179)
POTASSIUM: 4.3 mmol/L (ref 3.5–5.0)
PROTEIN TOTAL: 6.3 g/dL — ABNORMAL LOW (ref 6.5–8.3)
SODIUM: 138 mmol/L (ref 135–145)

## 2017-10-14 LAB — PROTEIN TOTAL: Protein:MCnc:Pt:Ser/Plas:Qn:: 6.3 — ABNORMAL LOW

## 2017-10-14 LAB — PLATELET COUNT: Lab: 268

## 2017-10-14 LAB — SMEAR REVIEW

## 2017-10-14 NOTE — Unmapped (Signed)
For appointments & questions Monday through Friday 8 AM???5 PM     Please call [phone] or Toll free [phone]    On Nights, Weekends and Holidays  Call [phone] and ask for the oncologist on call    Reasons to call emergency line may include:  Fever of 100.5 or greater  Nausea and/or vomiting not relieved with nausea medicine  Diarrhea or constipation not relieved with bowel regimen  Severe pain not relieved with usual pain regimen    Ladona Ridgel, ANP-BC  Breast Medical Oncology  [facility]/Oncology  [address]  [place], [place] [address]  Fax: [phone]

## 2017-10-14 NOTE — Unmapped (Signed)
Lab drawn and sent for analysis.

## 2017-10-14 NOTE — Unmapped (Signed)
Breast Cancer Return Patient Evaluation    Referring Physician: [name], Np  [address] [place], [place] [address]. [phone]    PCP: Diana Birmingham, NP    Reason for Visit: A 66 y.o. 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 Pleasant woman with overall plan of care: Neoadjuvant paclitaxel on [facility] 030, surgery, adjuvant chemotherapy informed by response and radiation. She completed 12 weeks paclitaxel 02/18/2017 with clinical and radiographic response in both breast and axilla and had breast conserving surgery / ALND 03/18/2017, which revealed ypT1 (1.2cm) N3 (14 involved LN) residual disease. Per the treating surgeon the nodal disease was intermixed with a nerve/vessel bundle and was challenging to dissect.  No evidence distant mets.     Overall plan of care: dose-dense AC, RT/weekly carbo, and then capecitabine according to CREATE-X principles. We have discussed the lower level of evidence for the platinum, which has fewer [facility] data to support it but is reasonable in the high risk setting.     Systemic therapy  - She completed dose-dense AC  - She has completed XRT.  Original plan for radiosensitizing carboplatin. She received the first dose of carboplatin on 07/05/17, subsequently [employer] denied the carboplatin. A peer-to-peer was held in which the [place] MD presented their policy of only accepting NCCN guideline-concordant therapy. Given the absence of the levels of evidence for carboplatin as a radiosensitizing agent in this setting it was denied.  Carboplatin is most likely the least important of the interventions and while justifiable on the basis of the high risk nature of the tumor and the need for RNI, it is without the kind of level I evidence required.   - CREATE-X Xeloda initiated 09/02/17.  She is about to start cycle 3 today, tolerating well  - Dose is 2000mg  BID (just over 1,000mg /M2).  Would consider escalating to 1250mg /M2 however considering her moderate fatigue and upcoming trade show will hold off until next cycle  - flu vaccine given 10/14/17    Axillary mass:  - ultrasound today benign, will follow clinically    Back pain:  Resolved.  MRI and bone scan negative.      Genetics. She had sequencing and deletion/duplication analysis of four genes: BRCA1, BRCA2, PALB2,  and TP53. The testing was performed at [facility] and did not identify any genetic changes associated with cancer predisposition. Monitor.     Dispo:  RTC 1 month with labs    -----------------------------------------------------------------    Interval Social / Medical Hx:  Here today unaccompanied  - about to start cycle 3 Xeloda.  She has been taking 2,000 mg BID with good tolerance.  Reports mild to moderate fatigue however she has also been very busy with work and travel.  Diarrhea x 1-2 day on day 16 or so.  No HFS  - pain at axillary incision - shooting and throbbing    Oncology History    2017: R TNBC.    Home treatment team [name], [place], [place]:  She has been referred to medical oncology, radiation oncology.  Discussion with surgery recommended neoadjuvant chemotherapy.        Malignant neoplasm of upper-outer quadrant of right breast in female, estrogen receptor negative (CMS-HCC)    09/2016 -  Presenting Symptoms     R breast mass palpated by her husband the day before her scheduled annual screening mammogram. MMG/US showed a right breast  UOQ mass measuring 1.9cm. Adenopathy was present in the R axilla.         11/02/2016 Biopsy     Right breast 10:00 core biopsy: IDC, gr 3, ER-(0), PR-(0), HER2 negative by FISH. Ki67: 40%. Right axillary core biopsy: +LN metastasis         11/10/2016 Initial Diagnosis     Malignant neoplasm of upper-outer quadrant of right breast in female, estrogen receptor negative (RAF-HCC)       11/11/2016 Tumor Board     MDC recs: cT2 N1 TN IDC. She is LN positive. Needs R add view MMG. Needs L MMG. Trial considerations: [facility] 030. If does NACT, would be a TAD surgical trial candidate. Potential BCT candidate. Meeting surgery, med/onc, rad/onc today.          12/03/2016 - 02/18/2017 Chemotherapy     Chemotherapy Treatment    Treatment Goal Curative   Line of Treatment [No plan line of treatment]   Plan Name STUDY [facility]CRC-030 IRB# [id number] ARM B: PACLItaxel (v. 08/03/16)   Start Date 12/03/2016   End Date 02/18/2017 (Planned)   Provider [name], MD   Chemotherapy dexamethasone (DECADRON) tablet 20 mg, 20 mg, Oral, Once, 1 of 4 cycles    PACLItaxel (TAXOL) 144.78 mg in sodium chloride 0.9% NON-PVC (NS) 250 mL IVPB, 80 mg/m2 = 144.78 mg, Intravenous, Once, 1 of 4 cycles              03/04/2017 Interval Scan(s)     Post chemo MMG/US at [facility]: 5 axillary lymph nodes measuring between 0.4 and 1.4 cm are identified. The largest has a normal-appearing fatty hilum. The axillary lymph node with the previously placed spring clip is the lowest (in the longitudinal direction) level 1 axillary lymph node imaged (annotated as the last lymph node imaged for the benefit of future SAVI SCOUT placement). This clipped lymph node measures 0.7 x 0.6 cm which corresponds to the mammographic size. The index mass in the far lateral 2:00 position right breast measures 1.7 x 0.8 cm and has an associated biopsy clip, corresponding to the mammographic image.    SAVI scout placed into 2 LNs.         03/18/2017 Surgery     Right breast savi scout partial mastectomy with ALND and ARM. She participated in the TAD protocol by Dr. [name].   Path: IDC, G3, 1.25 cm, triple negative. DCIS present. Superior margin positive for DCIS, TAD node positive (1/1), total 14/24 positive LN, largest met 11 mm. No LVI, No ECE. RCB 3.611 (RCB-III).  Stage IIIC (ypT1c ypN3a)         04/26/2017 -  Chemotherapy     Chemotherapy Treatment    Treatment Goal Curative   Line of Treatment [No plan line of treatment]   Plan Name STUDY TBCRC-030 IRB# [id number] ARM B: PACLItaxel (v. 08/03/16)   Start Date 12/03/2016   End Date 02/18/2017   Provider [name], MD   Chemotherapy dexamethasone (DECADRON) tablet 20 mg, 20 mg, Oral, Once, 4 of 4 cycles    PACLItaxel (TAXOL) 144.78 mg in sodium chloride 0.9% NON-PVC (NS) 250 mL IVPB, 80 mg/m2 = 144.78 mg, Intravenous, Once, 4 of 4 cycles       Chemotherapy Treatment    Treatment Goal Curative   Line of Treatment [No plan line of treatment]   Plan Name OP BREAST AC (DOSE DENSE)   Start Date 04/26/2017   End Date 06/07/2017 (Planned)   Provider Philomena Doheny  Iona Hansen, MD   Chemotherapy dexamethasone (DECADRON) tablet 12 mg, 12 mg, Oral, Once, 1 of 4 cycles    cyclophosphamide (CYTOXAN) 1,092 mg in sodium chloride (NS) 0.9% 250 mL IVPB, 600 mg/m2 = 1,092 mg, Intravenous, Once, 1 of 4 cycles    DOXOrubicin (ADRIAMYCIN) syringe 109.2 mg, 60 mg/m2 = 109.2 mg, Intravenous, Once, 1 of 4 cycles               Past Medical History:   Diagnosis Date   ??? Back pain    ??? Breast cancer (CMS-HCC) 10/2016    right   ??? GERD (gastroesophageal reflux disease)    ??? Hemorrhoids    ??? High blood pressure    ??? Melanoma (CMS-HCC)      Past Surgical History:   Procedure Laterality Date   ??? BREAST BIOPSY Right 10/2016    ca   ??? CHEMOTHERAPY      2017-2018   ??? CHOLECYSTECTOMY     ??? IR INSERT PORT AGE GREATER THAN 5 YRS  11/27/2016    IR INSERT PORT AGE GREATER THAN 5 YRS 11/27/2016 Ammie Dalton, MD IMG VIR MM MMNT   ??? PR INTRAOPERATIVE SENTINEL LYMPH NODE ID W DYE INJECTION Right 03/18/2017    Procedure: INTRAOPERATIVE IDENTIFICATION SENTINEL LYMPH NODE(S) INCLUDE INJECTION NON-RADIOACTIVE DYE, WHEN PERFORMED;  Surgeon: Talbert Cage, DO;  Location: [facility];  Service: Surgical Oncology   ??? PR MASTECTOMY, PARTIAL Right 03/18/2017    Procedure: MASTECTOMY, PARTIAL (EG, LUMPECTOMY, TYLECTOMY, QUADRANTECTOMY, SEGMENTECTOMY);  Surgeon: Talbert Cage, DO;  Location: ASC OR [facility];  Service: Surgical Oncology   ??? PR REMOVE ARMPITS LYMPH NODES COMPLT Right 03/18/2017 Procedure: AXILLARY LYMPHADENECTOMY; COMPLETE;  Surgeon: Talbert Cage, DO;  Location: ASC OR [facility];  Service: Surgical Oncology   ??? TONSILLECTOMY       Current Outpatient Prescriptions   Medication Sig Dispense Refill   ??? amLODIPine-valsartan (EXFORGE) 5-160 mg per tablet Take 1 tablet by mouth daily.      ??? atorvastatin (LIPITOR) 20 MG tablet Take 20 mg by mouth daily.     ??? calcium carbonate-vit D3-min 600 mg calcium- 400 unit Tab Take by mouth.     ??? capecitabine (XELODA) 500 MG tablet Take 4 tablets (2,000 mg total) by mouth Two (2) times a day. For 14 days then 7 days off 112 tablet 5   ??? docusate sodium (COLACE) 100 MG capsule Take 100 mg by mouth daily.     ??? ibandronate (BONIVA) 150 mg tablet Take 150 mg by mouth.     ??? LORazepam (ATIVAN) 0.5 MG tablet 1 tablet every 8 hours as needed for insomnia or nausea during chemo 15 tablet 0   ??? naproxen sodium (ALEVE) 220 MG tablet Take 220 mg by mouth 2 (two) times a day with meals.     ??? RABEprazole (ACIPHEX) 20 mg tablet Take 1 tablet by mouth  daily     ??? clindamycin (CLEOCIN T) 1 % external solution Apply to affected area 2 times daily (Patient not taking: Reported on 07/29/2017) 60 mL 2   ??? dexamethasone (DECADRON) 4 MG tablet Take 2 tabs in the AM with food on days 2,3,4 of each chemo cycle (Patient not taking: Reported on 07/29/2017) 24 tablet 0   ??? ondansetron (ZOFRAN) 8 MG tablet Take 1 tablet (8 mg total) by mouth every eight (8) hours as needed for nausea. (Patient not taking: Reported on 08/19/2017) 30 tablet 2     No current  facility-administered medications for this visit.      Facility-Administered Medications Ordered in Other Visits   Medication Dose Route Frequency Provider Last Rate Last Dose   ??? heparin, porcine (PF) 100 unit/mL injection 500 Units  500 Units Intravenous Q30 Min PRN Ladona Ridgel, ANP   500 Units at 10/14/17 8469   ??? heparin, porcine (PF) injection 500 Units  500 Units Intravenous Q MWF Ammie Dalton, MD       ??? sodium chloride (NS) 0.9 % infusion  75 mL/hr Intravenous Continuous Ammie Dalton, MD   Stopped at 11/27/16 1025   ??? 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 Has two sons, one lives in [place] the other in [place].      Family History   Problem Relation Age of Onset   ??? Breast cancer Paternal Grandmother         age unknown   ??? Ovarian cancer Cousin         maternal- her mother was on rx during preg.    ??? No Known Problems Mother    ??? No Known Problems Father    ??? No Known Problems Sister    ??? No Known Problems Daughter    ??? No Known Problems Maternal Grandmother    ??? No Known Problems Maternal Grandfather    ??? No Known Problems Paternal Grandfather    ??? Colon cancer Neg Hx    ??? Endometrial cancer Neg Hx    ??? BRCA 1/2 Neg Hx    ??? Cancer Neg Hx      Review of Systems: A 12-system review of systems was obtained including: Constitutional, Eyes, ENT, Cardiovascular, Respiratory, GI, GU, Musculoskeletal, Skin, Neurological, Psychiatric, Endocrine, Heme/Lymphatic, and Allergic/Immunologic systems. It is negative or non-contributory to the patient???s management except as detailed in Interval Hx.  For details see the Symptom Report Form (MIM#1170), which I have reviewed, signed and will be scanned into the record.  PS = 0    Physical Examination:  Vital Signs: BP 126/65  - Pulse 89  - Temp 36.7 ??C (98.1 ??F) (Oral)  - Resp 16  - Ht 165.1 cm (5' 5)  - Wt 73.7 kg (162 lb 8 oz)  - SpO2 99%  - BMI 27.04 kg/m??      General:  Healthy-appearing female in no acute distress.  HEENT: no erythema  Cardiovasc:  No heaves, regular, no additional sounds. No lower extremity edema.    Respiratory:  Chest clear to percussion and auscultation, unlabored  Gastrointestinal:  Soft, nontender, no hepatomegaly. Musculoskeletal:  No bony tenderness  Skin and Subcutaneous Tissues:  No rash noted  Psychiatric: Mood is normal.  No other symptoms.   Neuro:  Alert and oriented.  Gait and coordination normal  Upper Extremity Lymphedema: None.   Breast/Chest: healed incision right breast and axilla. There is a 2 cm induration high in the right axilla - marked for ultrasound today.  Right breast is tender with no palpable masses.   Left breast ok. Port left CW ok.  Heme/Lymphatic/Immunologic: No supraclavicular or axillary adenopathy    I have personally reviewed the following diagnostic studies:    Labs and pathology reviewed. See Results  for details

## 2017-10-14 NOTE — Unmapped (Signed)
Pharmacist Oral Chemotherapy Follow-Up    Diana David is a 66 y.o. female with triple negative breast cancer who I am following while on oral chemotherapy.    Oral chemotherapy regimen: Capecitabine 2000mg  BID x 14 days then 7 days off for 8 cycles  Start Date: ??09/02/2017   Pharmacy: [facility]     Adherence: No missed doses    Side effects: She noted tenderness on her feet after a lot of walking during her last cycle.  Her feet are okay now and she has found shoes that limit the tenderness.  During her first two cycles, she had diarrhea on either day 15 or 16.  She was able to control the diarrhea with diet and did not need Imodium.  Her main issue is fatigue that ebbs and flows with the capecitabine cycle.  She is continuing to do her day to day activities.    Drug interactions: No new medications to assess.    Assessment/Plan: Diana David is tolerating capecitabine well.  She will start Cycle 3 day 1 today.    Approximate time spent with patient: 15 minutes    Pertinent Labs   Lab on 10/14/2017   Component Date Value Ref Range Status   ??? Sodium 10/14/2017 138  135 - 145 mmol/L Final   ??? Potassium 10/14/2017 4.3  3.5 - 5.0 mmol/L Final   ??? Chloride 10/14/2017 100  98 - 107 mmol/L Final   ??? CO2 10/14/2017 29.0  22.0 - 30.0 mmol/L Final   ??? BUN 10/14/2017 15  7 - 21 mg/dL Final   ??? Creatinine 10/14/2017 0.54* 0.60 - 1.00 mg/dL Final   ??? BUN/Creatinine Ratio 10/14/2017 28   Final   ??? EGFR MDRD Non Af Amer 10/14/2017 >=60  >=60 mL/min/1.89m2 Final   ??? EGFR MDRD Af Amer 10/14/2017 >=60  >=60 mL/min/1.71m2 Final   ??? Anion Gap 10/14/2017 9  9 - 15 mmol/L Final   ??? Glucose 10/14/2017 108  65 - 179 mg/dL Final   ??? Calcium 13/07/6577 9.1  8.5 - 10.2 mg/dL Final   ??? Albumin 46/96/2952 3.9  3.5 - 5.0 g/dL Final   ??? Total Protein 10/14/2017 6.3* 6.5 - 8.3 g/dL Final   ??? Total Bilirubin 10/14/2017 0.8  0.0 - 1.2 mg/dL Final   ??? AST 84/13/2440 28  14 - 38 U/L Final   ??? ALT 10/14/2017 34  15 - 48 U/L Final   ??? Alkaline Phosphatase 10/14/2017 78  38 - 126 U/L Final   ??? WBC 10/14/2017 3.0* 4.5 - 11.0 10*9/L Final   ??? RBC 10/14/2017 3.67* 4.00 - 5.20 10*12/L Final   ??? HGB 10/14/2017 11.5* 12.0 - 16.0 g/dL Final   ??? HCT 10/17/2535 34.3* 36.0 - 46.0 % Final   ??? MCV 10/14/2017 93.4  80.0 - 100.0 fL Final   ??? MCH 10/14/2017 31.3  26.0 - 34.0 pg Final   ??? MCHC 10/14/2017 33.6  31.0 - 37.0 g/dL Final   ??? RDW 64/40/3474 17.7* 12.0 - 15.0 % Final   ??? MPV 10/14/2017 6.8* 7.0 - 10.0 fL Final   ??? Platelet 10/14/2017 268  150 - 440 10*9/L Final   ??? Absolute Neutrophils 10/14/2017 1.8* 2.0 - 7.5 10*9/L Final   ??? Absolute Lymphocytes 10/14/2017 0.5* 1.5 - 5.0 10*9/L Final   ??? Absolute Monocytes 10/14/2017 0.4  0.2 - 0.8 10*9/L Final   ??? Absolute Eosinophils 10/14/2017 0.2  0.0 - 0.4 10*9/L Final   ??? Absolute Basophils 10/14/2017  0.0  0.0 - 0.1 10*9/L Final   ??? Large Unstained Cells 10/14/2017 6* 0 - 4 % Final   ??? Macrocytosis 10/14/2017 Moderate* Not Present Final   ??? Anisocytosis 10/14/2017 Slight* Not Present Final

## 2017-11-18 ENCOUNTER — Ambulatory Visit
Admission: RE | Admit: 2017-11-18 | Discharge: 2017-11-18 | Disposition: A | Discharge status: Home / Self Care | Payer: Commercial Managed Care - PPO | Attending: Adult Health | Admitting: Adult Health

## 2017-11-18 ENCOUNTER — Ambulatory Visit
Admission: RE | Admit: 2017-11-18 | Discharge: 2017-11-18 | Disposition: A | Discharge status: Home / Self Care | Payer: Commercial Managed Care - PPO

## 2017-11-18 DIAGNOSIS — Z171 Estrogen receptor negative status [ER-]: Secondary | ICD-10-CM

## 2017-11-18 DIAGNOSIS — C50919 Malignant neoplasm of unspecified site of unspecified female breast: Principal | ICD-10-CM

## 2017-11-18 DIAGNOSIS — C50911 Malignant neoplasm of unspecified site of right female breast: Principal | ICD-10-CM

## 2017-11-18 LAB — COMPREHENSIVE METABOLIC PANEL
ALBUMIN: 4 g/dL (ref 3.5–5.0)
ALKALINE PHOSPHATASE: 74 U/L (ref 38–126)
ALT (SGPT): 44 U/L (ref 15–48)
ANION GAP: 8 mmol/L — ABNORMAL LOW (ref 9–15)
BLOOD UREA NITROGEN: 18 mg/dL (ref 7–21)
CALCIUM: 8.9 mg/dL (ref 8.5–10.2)
CHLORIDE: 104 mmol/L (ref 98–107)
CO2: 26 mmol/L (ref 22.0–30.0)
CREATININE: 0.48 mg/dL — ABNORMAL LOW (ref 0.60–1.00)
EGFR MDRD AF AMER: >=60 mL/min/{1.73_m2} (ref >=60)
EGFR MDRD NON AF AMER: >=60 mL/min/{1.73_m2} (ref >=60)
GLUCOSE RANDOM: 101 mg/dL (ref 65–179)
POTASSIUM: 4 mmol/L (ref 3.5–5.0)
SODIUM: 138 mmol/L (ref 135–145)

## 2017-11-18 LAB — CBC W/ AUTO DIFF
BASOPHILS ABSOLUTE COUNT: 0 10*9/L (ref 0.0–0.1)
EOSINOPHILS ABSOLUTE COUNT: 0.4 10*9/L (ref 0.0–0.4)
HEMATOCRIT: 32.6 % — ABNORMAL LOW (ref 36.0–46.0)
HEMOGLOBIN: 11.4 g/dL — ABNORMAL LOW (ref 12.0–16.0)
LARGE UNSTAINED CELLS: 4 % (ref 0–4)
LYMPHOCYTES ABSOLUTE COUNT: 0.6 10*9/L — ABNORMAL LOW (ref 1.5–5.0)
MEAN CORPUSCULAR HEMOGLOBIN CONC: 34.8 g/dL (ref 31.0–37.0)
MEAN CORPUSCULAR HEMOGLOBIN: 34.2 pg — ABNORMAL HIGH (ref 26.0–34.0)
MEAN CORPUSCULAR VOLUME: 98.1 fL (ref 80.0–100.0)
NEUTROPHILS ABSOLUTE COUNT: 2.8 10*9/L (ref 2.0–7.5)
PLATELET COUNT: 252 10*9/L (ref 150–440)
RED CELL DISTRIBUTION WIDTH: 20.8 % — ABNORMAL HIGH (ref 12.0–15.0)
WBC ADJUSTED: 4.3 10*9/L — ABNORMAL LOW (ref 4.5–11.0)

## 2017-11-18 LAB — SMEAR REVIEW

## 2017-11-18 LAB — WBC ADJUSTED: Lab: 4.3 — ABNORMAL LOW

## 2017-11-18 LAB — BUN / CREAT RATIO: Urea nitrogen/Creatinine:MRto:Pt:Ser/Plas:Qn:: 38

## 2017-11-18 NOTE — Unmapped (Signed)
Breast Cancer Return Patient Evaluation    Referring Physician: [name], Np  [address] [place], [place] [address]. [phone]    PCP: [name], NP    Reason for Visit: A 66 y.o. 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 Pleasant woman with overall plan of care: Neoadjuvant paclitaxel on [facility] 030, surgery, adjuvant chemotherapy informed by response and radiation. She completed 12 weeks paclitaxel 02/18/2017 with clinical and radiographic response in both breast and axilla and had breast conserving surgery / ALND 03/18/2017, which revealed ypT1 (1.2cm) N3 (14 involved LN) residual disease. Per the treating surgeon the nodal disease was intermixed with a nerve/vessel bundle and was challenging to dissect.  No evidence distant mets.     Overall plan of care: dose-dense AC, RT/weekly carbo, and then capecitabine according to CREATE-X principles. We have discussed the lower level of evidence for the platinum, which has fewer [facility] data to support it but is reasonable in the high risk setting.     Systemic therapy  - She completed dose-dense AC  - She has completed XRT.  Original plan for radiosensitizing carboplatin. She received the first dose of carboplatin on 07/05/17, subsequently [employer] denied the carboplatin. A peer-to-peer was held in which the [place] MD presented their policy of only accepting NCCN guideline-concordant therapy. Given the absence of the levels of evidence for carboplatin as a radiosensitizing agent in this setting it was denied.  Carboplatin is most likely the least important of the interventions and while justifiable on the basis of the high risk nature of the tumor and the need for RNI, it is without the kind of level I evidence required.   - CREATE-X Xeloda initiated 09/02/17.  She just finished cycle 4 now with grade 2 HFS.  Will hold for 10 days and restart at 2000mg  AM and 1500mg  PM.  Discussed with [name], PharmD today.    - flu vaccine given 10/14/17    Axillary mass:  - ultrasound 10/14/17 benign, will follow clinically    Back pain:  Resolved.  MRI and bone scan negative.      Genetics. She had sequencing and deletion/duplication analysis of four genes: BRCA1, BRCA2, PALB2,  and TP53. The testing was performed at [facility] and did not identify any genetic changes associated with cancer predisposition. Monitor.     Dispo:  RTC 1 month with labs    -----------------------------------------------------------------    Interval Social / Medical Hx:  Here today unaccompanied  - just finishing cycle 4.  She has been taking 2,000 mg BID.  Reports moderate fatigue, mild diarrhea near the end of the cycle and some redness and peeling of her feet just starting in the last week.  Has not     Oncology History    2017: R TNBC.    Home treatment team [name], [place], [place]:  She has been referred to medical oncology, radiation oncology.  Discussion with surgery recommended neoadjuvant chemotherapy.        Malignant neoplasm of upper-outer quadrant of right breast in female, estrogen receptor negative (CMS-HCC)    09/2016 -  Presenting Symptoms     R breast mass palpated by her husband the day before her scheduled annual screening mammogram. MMG/US showed a right breast UOQ mass measuring 1.9cm. Adenopathy was present in the R axilla.         11/02/2016 Biopsy  Right breast 10:00 core biopsy: IDC, gr 3, ER-(0), PR-(0), HER2 negative by FISH. Ki67: 40%. Right axillary core biopsy: +LN metastasis         11/10/2016 Initial Diagnosis     Malignant neoplasm of upper-outer quadrant of right breast in female, estrogen receptor negative (RAF-HCC)       11/11/2016 Tumor Board     MDC recs: cT2 N1 TN IDC. She is LN positive. Needs R add view MMG. Needs L MMG. Trial considerations: [facility] 030. If does NACT, would be a TAD surgical trial candidate. Potential BCT candidate. Meeting surgery, med/onc, rad/onc today. 12/03/2016 - 02/18/2017 Chemotherapy     Chemotherapy Treatment    Treatment Goal Curative   Line of Treatment [No plan line of treatment]   Plan Name STUDY [facility]-030 IRB# [id number] ARM B: PACLItaxel (v. 08/03/16)   Start Date 12/03/2016   End Date 02/18/2017 (Planned)   Provider [name], MD   Chemotherapy dexamethasone (DECADRON) tablet 20 mg, 20 mg, Oral, Once, 1 of 4 cycles    PACLItaxel (TAXOL) 144.78 mg in sodium chloride 0.9% NON-PVC (NS) 250 mL IVPB, 80 mg/m2 = 144.78 mg, Intravenous, Once, 1 of 4 cycles              03/04/2017 Interval Scan(s)     Post chemo MMG/US at [facility]: 5 axillary lymph nodes measuring between 0.4 and 1.4 cm are identified. The largest has a normal-appearing fatty hilum. The axillary lymph node with the previously placed spring clip is the lowest (in the longitudinal direction) level 1 axillary lymph node imaged (annotated as the last lymph node imaged for the benefit of future SAVI SCOUT placement). This clipped lymph node measures 0.7 x 0.6 cm which corresponds to the mammographic size. The index mass in the far lateral 2:00 position right breast measures 1.7 x 0.8 cm and has an associated biopsy clip, corresponding to the mammographic image.    SAVI scout placed into 2 LNs.         03/18/2017 Surgery     Right breast savi scout partial mastectomy with ALND and ARM. She participated in the TAD protocol by Dr. [name].   Path: IDC, G3, 1.25 cm, triple negative. DCIS present. Superior margin positive for DCIS, TAD node positive (1/1), total 14/24 positive LN, largest met 11 mm. No LVI, No ECE. RCB 3.611 (RCB-III).  Stage IIIC (ypT1c ypN3a)         04/26/2017 -  Chemotherapy     Chemotherapy Treatment    Treatment Goal Curative   Line of Treatment [No plan line of treatment]   Plan Name STUDY [facility]-030 IRB# [id number] ARM B: PACLItaxel (v. 08/03/16)   Start Date 12/03/2016   End Date 02/18/2017   Provider [name], MD   Chemotherapy dexamethasone (DECADRON) tablet 20 mg, 20 mg, Oral, Once, 4 of 4 cycles    PACLItaxel (TAXOL) 144.78 mg in sodium chloride 0.9% NON-PVC (NS) 250 mL IVPB, 80 mg/m2 = 144.78 mg, Intravenous, Once, 4 of 4 cycles       Chemotherapy Treatment    Treatment Goal Curative   Line of Treatment [No plan line of treatment]   Plan Name OP BREAST AC (DOSE DENSE)   Start Date 04/26/2017   End Date 06/07/2017 (Planned)   Provider [name], MD   Chemotherapy dexamethasone (DECADRON) tablet 12 mg, 12 mg, Oral, Once, 1 of 4 cycles    cyclophosphamide (CYTOXAN) 1,092 mg in sodium chloride (NS) 0.9% 250 mL IVPB, 600  mg/m2 = 1,092 mg, Intravenous, Once, 1 of 4 cycles    DOXOrubicin (ADRIAMYCIN) syringe 109.2 mg, 60 mg/m2 = 109.2 mg, Intravenous, Once, 1 of 4 cycles               Past Medical History:   Diagnosis Date   ??? Back pain    ??? Breast cancer (CMS-HCC) 10/2016    right   ??? GERD (gastroesophageal reflux disease)    ??? Hemorrhoids    ??? High blood pressure    ??? Melanoma (CMS-HCC)      Past Surgical History:   Procedure Laterality Date   ??? BREAST BIOPSY Right 10/2016    ca   ??? BREAST LUMPECTOMY Right 02/2017   ??? CHEMOTHERAPY      2017-2018   ??? CHOLECYSTECTOMY     ??? IR INSERT PORT AGE GREATER THAN 5 YRS  11/27/2016    IR INSERT PORT AGE GREATER THAN 5 YRS 11/27/2016 Ammie Dalton, MD IMG VIR MM MMNT   ??? PR INTRAOPERATIVE SENTINEL LYMPH NODE ID W DYE INJECTION Right 03/18/2017    Procedure: INTRAOPERATIVE IDENTIFICATION SENTINEL LYMPH NODE(S) INCLUDE INJECTION NON-RADIOACTIVE DYE, WHEN PERFORMED;  Surgeon: Talbert Cage, DO;  Location: ASC OR [facility];  Service: Surgical Oncology   ??? PR MASTECTOMY, PARTIAL Right 03/18/2017    Procedure: MASTECTOMY, PARTIAL (EG, LUMPECTOMY, TYLECTOMY, QUADRANTECTOMY, SEGMENTECTOMY);  Surgeon: Talbert Cage, DO;  Location: ASC OR [facility];  Service: Surgical Oncology   ??? PR REMOVE ARMPITS LYMPH NODES COMPLT Right 03/18/2017    Procedure: AXILLARY LYMPHADENECTOMY; COMPLETE;  Surgeon: Talbert Cage, DO;  Location: ASC OR [facility];  Service: Surgical Oncology   ??? RADIATION Right     2018   ??? TONSILLECTOMY       Current Outpatient Prescriptions   Medication Sig Dispense Refill   ??? amLODIPine-valsartan (EXFORGE) 5-160 mg per tablet Take 1 tablet by mouth daily.      ??? atorvastatin (LIPITOR) 20 MG tablet Take 20 mg by mouth daily.     ??? calcium carbonate-vit D3-min 600 mg calcium- 400 unit Tab Take by mouth.     ??? capecitabine (XELODA) 500 MG tablet Take 4 tablets (2,000 mg total) by mouth Two (2) times a day. For 14 days then 7 days off 112 tablet 5   ??? dexamethasone (DECADRON) 4 MG tablet Take 2 tabs in the AM with food on days 2,3,4 of each chemo cycle 24 tablet 0   ??? ibandronate (BONIVA) 150 mg tablet Take 150 mg by mouth.     ??? LORazepam (ATIVAN) 0.5 MG tablet 1 tablet every 8 hours as needed for insomnia or nausea during chemo 15 tablet 0   ??? naproxen sodium (ALEVE) 220 MG tablet Take 220 mg by mouth 2 (two) times a day with meals.     ??? RABEprazole (ACIPHEX) 20 mg tablet Take 1 tablet by mouth  daily     ??? clindamycin (CLEOCIN T) 1 % external solution Apply to affected area 2 times daily (Patient not taking: Reported on 11/18/2017) 60 mL 2   ??? docusate sodium (COLACE) 100 MG capsule Take 100 mg by mouth daily.     ??? ondansetron (ZOFRAN) 8 MG tablet Take 1 tablet (8 mg total) by mouth every eight (8) hours as needed for nausea. (Patient not taking: Reported on 11/18/2017) 30 tablet 2     No current facility-administered medications for this visit.      Facility-Administered Medications Ordered in Other Visits   Medication Dose  Route Frequency Provider Last Rate Last Dose   ??? heparin, porcine (PF) injection 500 Units  500 Units Intravenous Q MWF Ammie Dalton, MD       ??? 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 Has two sons, one lives in [place] the other in [place].      Family History   Problem Relation Age of Onset   ??? Breast cancer Paternal Grandmother         age unknown   ??? Ovarian cancer Cousin         maternal- her mother was on rx during preg.    ??? No Known Problems Mother    ??? No Known Problems Father    ??? No Known Problems Sister    ??? No Known Problems Daughter    ??? No Known Problems Maternal Grandmother    ??? No Known Problems Maternal Grandfather    ??? No Known Problems Paternal Grandfather    ??? Colon cancer Neg Hx    ??? Endometrial cancer Neg Hx    ??? BRCA 1/2 Neg Hx      Review of Systems: A 12-system review of systems was obtained including: Constitutional, Eyes, ENT, Cardiovascular, Respiratory, GI, GU, Musculoskeletal, Skin, Neurological, Psychiatric, Endocrine, Heme/Lymphatic, and Allergic/Immunologic systems. It is negative or non-contributory to the patient???s management except as detailed in Interval Hx.  For details see the Symptom Report Form (MIM#1170), which I have reviewed, signed and will be scanned into the record.  PS = 0    Physical Examination:  Vital Signs: BP 122/56  - Pulse 87  - Temp 36.8 ??C (98.3 ??F) (Oral)  - Resp 16  - Ht 165.1 cm (5' 5)  - Wt 76 kg (167 lb 8 oz)  - SpO2 98%  - BMI 27.87 kg/m??      General:  Healthy-appearing female in no acute distress.  HEENT: no erythema  Cardiovasc:  No heaves, regular, no additional sounds. No lower extremity edema.    Respiratory:  Chest clear to percussion and auscultation, unlabored  Gastrointestinal:  Soft, nontender, no hepatomegaly.   Musculoskeletal:  No bony tenderness  Skin and Subcutaneous Tissues:  Soles of her feet with erythema and some peeling noted - see photo below  Psychiatric: Mood is normal.  No other symptoms.   Neuro:  Alert and oriented.  Gait and coordination normal  Upper Extremity Lymphedema: None.   Breast/Chest: healed incision right breast and axilla. There is a 2 cm induration high in the right axilla, ultrasound reveals surgical changes, stable today  Right breast is tender with no palpable masses.   Left breast ok. Port left CW ok.  Heme/Lymphatic/Immunologic: No supraclavicular or axillary adenopathy                I have personally reviewed the following diagnostic studies:    Labs and pathology reviewed. See Results for details

## 2017-11-18 NOTE — Unmapped (Signed)
Hold your Xeloda for at least 10 days.  If your feet are not improved please call or write to me.  When you restart take 4 tablets in the AM and 3 tablets in the PM.      For appointments & questions Monday through Friday 8 AM???5 PM     Please call [phone] or Toll free [phone]    On Nights, Weekends and Holidays  Call [phone] and ask for the oncologist on call    Reasons to call emergency line may include:  Fever of 100.5 or greater  Nausea and/or vomiting not relieved with nausea medicine  Diarrhea or constipation not relieved with bowel regimen  Severe pain not relieved with usual pain regimen    Ladona Ridgel, ANP-BC  Breast Medical Oncology  [facility]ematology/Oncology  [address]  [place], [place] [address]  Fax: [phone]

## 2017-11-18 NOTE — Unmapped (Signed)
Lab drawn and sent for analysis, care provided by P. Olaf Mesa RN

## 2017-11-24 NOTE — Unmapped (Signed)
Pharmacist Oral Chemotherapy Follow-Up    Diana David is a 66 y.o. female with triple negative breast cancer who I am following while on oral chemotherapy.    Oral chemotherapy regimen: Capecitabine 2000mg  BID x 14 days then 7 days off for 8 cycles  Start Date: ??09/02/2017   Pharmacy: [facility]    Adherence: She started one day late.  She places each dose into a labeled Ziploc bag to help her remember her doses.      Side effects:  1. Fatigue - This past cycle her energy levels were good and her fatigue was tolerable  2. Diarrhea - She continues to have 2-3 days of diarrhea on day 15 or 16 of each cycle.  She manages the diarrhea with diet.  3. HFS - Diana David's feet are very tender with significant peeling.  Lubertha Basque, NP, instructed patient to hold capecitabine x 10 days    Drug Interactions: None identified    Assessment/Plan: Diana David is experiencing symptoms of HFS.  I instructed Diana David to continue to use lotions even while holding capecitabine.  I will call her in 10 days to assess if her HFS has improved enough to start the next cycle.    Approximate time spent with patient: 15 minutes    Pertinent Labs   Lab on 11/18/2017   Component Date Value Ref Range Status   ??? Sodium 11/18/2017 138  135 - 145 mmol/L Final   ??? Potassium 11/18/2017 4.0  3.5 - 5.0 mmol/L Final   ??? Chloride 11/18/2017 104  98 - 107 mmol/L Final   ??? CO2 11/18/2017 26.0  22.0 - 30.0 mmol/L Final   ??? BUN 11/18/2017 18  7 - 21 mg/dL Final   ??? Creatinine 11/18/2017 0.48* 0.60 - 1.00 mg/dL Final   ??? BUN/Creatinine Ratio 11/18/2017 38   Final   ??? EGFR MDRD Non Af Amer 11/18/2017 >=60  >=60 mL/min/1.57m2 Final   ??? EGFR MDRD Af Amer 11/18/2017 >=60  >=60 mL/min/1.17m2 Final   ??? Anion Gap 11/18/2017 8* 9 - 15 mmol/L Final   ??? Glucose 11/18/2017 101  65 - 179 mg/dL Final   ??? Calcium 16/09/9603 8.9  8.5 - 10.2 mg/dL Final   ??? Albumin 54/08/8118 4.0  3.5 - 5.0 g/dL Final   ??? Total Protein 11/18/2017 6.4* 6.5 - 8.3 g/dL Final ??? Total Bilirubin 11/18/2017 1.0  0.0 - 1.2 mg/dL Final   ??? AST 14/78/2956 33  14 - 38 U/L Final   ??? ALT 11/18/2017 44  15 - 48 U/L Final   ??? Alkaline Phosphatase 11/18/2017 74  38 - 126 U/L Final   ??? WBC 11/18/2017 4.3* 4.5 - 11.0 10*9/L Final   ??? RBC 11/18/2017 3.32* 4.00 - 5.20 10*12/L Final   ??? HGB 11/18/2017 11.4* 12.0 - 16.0 g/dL Final   ??? HCT 21/30/8657 32.6* 36.0 - 46.0 % Final   ??? MCV 11/18/2017 98.1  80.0 - 100.0 fL Final   ??? MCH 11/18/2017 34.2* 26.0 - 34.0 pg Final   ??? MCHC 11/18/2017 34.8  31.0 - 37.0 g/dL Final   ??? RDW 84/69/6295 20.8* 12.0 - 15.0 % Final   ??? MPV 11/18/2017 7.4  7.0 - 10.0 fL Final   ??? Platelet 11/18/2017 252  150 - 440 10*9/L Final   ??? Absolute Neutrophils 11/18/2017 2.8  2.0 - 7.5 10*9/L Final   ??? Absolute Lymphocytes 11/18/2017 0.6* 1.5 - 5.0 10*9/L Final   ??? Absolute Monocytes 11/18/2017 0.3  0.2 - 0.8 10*9/L Final   ??? Absolute Eosinophils 11/18/2017 0.4  0.0 - 0.4 10*9/L Final   ??? Absolute Basophils 11/18/2017 0.0  0.0 - 0.1 10*9/L Final   ??? Large Unstained Cells 11/18/2017 4  0 - 4 % Final   ??? Macrocytosis 11/18/2017 Marked* Not Present Final   ??? Anisocytosis 11/18/2017 Moderate* Not Present Final   ??? Smear Review Comments 11/18/2017 See Comment* Undefined Final    Slide reviewed

## 2017-12-01 NOTE — Unmapped (Signed)
PT'S INSURANCE IS NOW REQUIRING THEM TO USE ANOTHER SPECIALTY PHARMACY INSTEAD. DISNEROLLING THEM FROM SPECIALTY OUTREACH CALLS NOW.

## 2017-12-09 ENCOUNTER — Ambulatory Visit
Admission: RE | Admit: 2017-12-09 | Discharge: 2017-12-09 | Disposition: A | Discharge status: Home / Self Care | Payer: Commercial Managed Care - PPO

## 2017-12-09 ENCOUNTER — Ambulatory Visit
Admission: RE | Admit: 2017-12-09 | Discharge: 2017-12-09 | Disposition: A | Discharge status: Home / Self Care | Payer: Commercial Managed Care - PPO | Attending: Adult Health | Admitting: Adult Health

## 2017-12-09 DIAGNOSIS — Z171 Estrogen receptor negative status [ER-]: Secondary | ICD-10-CM

## 2017-12-09 DIAGNOSIS — C50911 Malignant neoplasm of unspecified site of right female breast: Principal | ICD-10-CM

## 2017-12-09 DIAGNOSIS — C50411 Malignant neoplasm of upper-outer quadrant of right female breast: Secondary | ICD-10-CM

## 2017-12-09 DIAGNOSIS — Z170 Estrogen receptor positive status [ER+]: Secondary | ICD-10-CM

## 2017-12-11 MED ORDER — CAPECITABINE 500 MG TABLET
ORAL_TABLET | 3 refills | 0 days | Status: CP
Start: 2017-12-11 — End: 2017-12-13

## 2017-12-13 MED ORDER — CAPECITABINE 500 MG TABLET
ORAL_TABLET | 3 refills | 0 days | Status: CP
Start: 2017-12-13 — End: 2018-01-04

## 2017-12-30 ENCOUNTER — Encounter: Admit: 2017-12-30 | Discharge: 2017-12-31 | Payer: MEDICARE | Attending: Adult Health | Primary: Adult Health

## 2017-12-30 ENCOUNTER — Encounter: Admit: 2017-12-30 | Discharge: 2017-12-31 | Payer: MEDICARE

## 2017-12-30 DIAGNOSIS — C50919 Malignant neoplasm of unspecified site of unspecified female breast: Principal | ICD-10-CM

## 2017-12-30 DIAGNOSIS — Z171 Estrogen receptor negative status [ER-]: Secondary | ICD-10-CM

## 2017-12-30 DIAGNOSIS — C50411 Malignant neoplasm of upper-outer quadrant of right female breast: Secondary | ICD-10-CM

## 2018-01-04 MED ORDER — XELODA 500 MG TABLET
ORAL_TABLET | 3 refills | 0 days | Status: CP
Start: 2018-01-04 — End: 2019-02-22

## 2018-01-27 ENCOUNTER — Encounter: Admit: 2018-01-27 | Discharge: 2018-01-28 | Payer: MEDICARE

## 2018-01-27 ENCOUNTER — Ambulatory Visit
Admit: 2018-01-27 | Discharge: 2018-01-28 | Payer: MEDICARE | Attending: Hematology & Oncology | Primary: Hematology & Oncology

## 2018-01-27 DIAGNOSIS — Z171 Estrogen receptor negative status [ER-]: Secondary | ICD-10-CM

## 2018-01-27 DIAGNOSIS — C50411 Malignant neoplasm of upper-outer quadrant of right female breast: Principal | ICD-10-CM

## 2018-02-14 ENCOUNTER — Encounter
Admit: 2018-02-14 | Discharge: 2018-02-17 | Payer: MEDICARE | Attending: Radiation Oncology | Primary: Radiation Oncology

## 2018-02-14 ENCOUNTER — Encounter: Admit: 2018-02-14 | Discharge: 2018-02-14 | Payer: MEDICARE

## 2018-02-14 ENCOUNTER — Encounter: Admit: 2018-02-14 | Discharge: 2018-02-17 | Payer: MEDICARE

## 2018-02-14 DIAGNOSIS — Z171 Estrogen receptor negative status [ER-]: Secondary | ICD-10-CM

## 2018-02-14 DIAGNOSIS — C50411 Malignant neoplasm of upper-outer quadrant of right female breast: Principal | ICD-10-CM

## 2018-02-24 ENCOUNTER — Encounter: Payer: Self-pay | Admitting: Physical Therapy

## 2018-02-24 ENCOUNTER — Other Ambulatory Visit: Payer: Self-pay

## 2018-02-24 ENCOUNTER — Ambulatory Visit: Payer: 59 | Attending: Radiation Oncology | Admitting: Physical Therapy

## 2018-02-24 DIAGNOSIS — M25611 Stiffness of right shoulder, not elsewhere classified: Secondary | ICD-10-CM

## 2018-02-24 DIAGNOSIS — L599 Disorder of the skin and subcutaneous tissue related to radiation, unspecified: Secondary | ICD-10-CM | POA: Diagnosis present

## 2018-02-24 DIAGNOSIS — I890 Lymphedema, not elsewhere classified: Secondary | ICD-10-CM | POA: Diagnosis not present

## 2018-02-24 DIAGNOSIS — M6281 Muscle weakness (generalized): Secondary | ICD-10-CM | POA: Diagnosis present

## 2018-02-24 NOTE — Therapy (Signed)
[facility] [address] [place], [place], [address] Phone: [phone]   Fax:  [phone]  Physical Therapy Evaluation  Patient Details  Name: Andrea Mccann MRN: [id number] Date of Birth: 09-07-1951 Referring Provider: Thom Chimes   Encounter Date: 02/24/2018  PT End of Session - 02/24/18 1158    Visit Number  1    Number of Visits  9    Date for PT Re-Evaluation  03/24/18    PT Start Time  1109    PT Stop Time  1152    PT Time Calculation (min)  43 min    Activity Tolerance  Patient tolerated treatment well    Behavior During Therapy  [facility] for tasks assessed/performed       History reviewed. No pertinent past medical history.  History reviewed. No pertinent surgical history.  There were no vitals filed for this visit.   Subjective Assessment - 02/24/18 1116    Subjective  I started noticing some swelling in my right forearm a couple of weeks ago. I had a right lumpectomy last year on Good Friday. I had an ALND and they took out 24. 14 of those were positive. I completed radiation and chemotherapy.    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  No/denies         [facility] PT Assessment - 02/24/18 0001      Assessment   Medical Diagnosis  right breast cancer    Referring Provider  Thom Chimes    Onset Date/Surgical Date  03/18/17    Hand Dominance  Right    Prior Therapy  none      Precautions   Precautions  Other (comment)    Precaution Comments  at risk for lymphedema      Restrictions   Weight Bearing Restrictions  No      Balance Screen   Has the patient fallen in the past 6 months  No    Has the patient had a decrease in activity level because of a fear of falling?   No    Is the patient  reluctant to leave their home because of a fear of falling?   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    Leisure  pt used to walk 45 min daily then her knee started hurting, I am supposed to have arthroscopic surgery on it      Cognition   Overall Cognitive Status  Within Functional Limits for tasks assessed      Observation/Other Assessments   Observations  right breast  more swollen and firm       ROM / Strength   AROM / PROM / Strength  AROM      AROM   AROM Assessment Site  Shoulder    Right/Left Shoulder  Right;Left    Right Shoulder Flexion  149 Degrees    Right Shoulder ABduction  113 Degrees    Right Shoulder Internal Rotation  70 Degrees    Right Shoulder External Rotation  60 Degrees    Left Shoulder Flexion  163 Degrees    Left Shoulder ABduction  168 Degrees    Left Shoulder Internal Rotation  60 Degrees    Left Shoulder External Rotation  86 Degrees        LYMPHEDEMA/ONCOLOGY QUESTIONNAIRE - 02/24/18 1132      Type   Cancer Type  right breast cancer      Surgeries   Lumpectomy Date  03/18/17    Axillary Lymph Node Dissection Date  03/18/17    Number Lymph Nodes Removed  24 14 positive      Date Lymphedema/Swelling Started   Date  02/10/18      Treatment   Active Chemotherapy Treatment  No    Past Chemotherapy Treatment  Yes    Active Radiation Treatment  No    Past Radiation Treatment  Yes    Current Hormone Treatment  No    Past  Hormone Therapy  No      What other symptoms do you have   Are you Having Heaviness or Tightness  Yes    Are you having Pain  Yes    Are you having pitting edema  No    Is it Hard or Difficult finding clothes that fit  No    Do you have infections  No    Is there Decreased scar mobility  Yes      Lymphedema Assessments   Lymphedema Assessments  Upper extremities      Right Upper Extremity Lymphedema   15 cm Proximal to Olecranon Process  31.5 cm    10 cm Proximal to Olecranon Process  31 cm    Olecranon Process  29 cm    15 cm Proximal to Ulnar Styloid Process  28.5 cm    10 cm Proximal to Ulnar Styloid Process  25 cm    Just Proximal to Ulnar Styloid Process  17.2 cm    Across Hand at [facility]  21 cm    At [place] of 2nd Digit  6.9 cm      Left Upper Extremity Lymphedema   15 cm Proximal to Olecranon Process  30.5 cm    10 cm Proximal to Olecranon Process  29.5 cm    Olecranon Process  27 cm    15 cm Proximal to Ulnar Styloid Process  25.2 cm    10 cm Proximal to Ulnar Styloid Process  21.5 cm    Just Proximal to Ulnar Styloid Process  16.8 cm    Across Hand at [facility]  20.8 cm    At [place] of 2nd Digit  6.8 cm        Quick Dash - 02/24/18 0001    Open a tight or new jar  Moderate difficulty    Do heavy household chores (wash walls, wash floors)  No difficulty    Carry a shopping bag or briefcase  No difficulty    Wash your back  No difficulty    Use a knife to cut  food  No difficulty    Recreational activities in which you take some force or impact through your arm, shoulder, or hand (golf, hammering, tennis)  No difficulty    During the past week, to what extent has your arm, shoulder or hand problem interfered with your normal social activities with family, friends, neighbors, or groups?  Not at all    During the past week, to what extent has your arm, shoulder or hand problem limited your work or other regular daily activities  Not at all    Arm, shoulder,  or hand pain.  Mild    Tingling (pins and needles) in your arm, shoulder, or hand  Mild    Difficulty Sleeping  No difficulty    DASH Score  9.09 %       Objective measurements completed on examination: See above findings.              PT Education - 02/24/18 1157    Education provided  Yes    Education Details  anatomy and physiology of lymphatic system, ABC class, lymphedema risk reduction    Person(s) Educated  Patient    Methods  Explanation;Handout    Comprehension  Verbalized understanding          PT Long Term Goals - 02/24/18 1203      PT LONG TERM GOAL #1   Title  Pt will demonstrate 160 degrees of right shoulder flexion to allow her to reach overehad    Baseline  149    Time  4    Period  Weeks    Status  New    Target Date  03/24/18      PT LONG TERM GOAL #2   Title  Pt will demonstrate 160 degrees of right shoulder abduction to allow her to reach out to sides    Baseline  113    Time  4    Period  Weeks    Status  New    Target Date  03/24/18      PT LONG TERM GOAL #3   Title  Pt will obtain appropriate compression garments (compression bra, day and night time sleeve and glove) for long term management of lymphedema    Time  4    Period  Weeks    Status  New    Target Date  03/24/18      PT LONG TERM GOAL #4   Title  Pt will be independent with a home exercise program for continued strengthening and stretching    Time  4    Period  Weeks    Status  New    Target Date  03/24/18      PT LONG TERM GOAL #5   Title  Pt will be independent in self manual lymphatic drainage for long term management of lymphedema    Time  4    Period  Weeks    Status  New    Target Date  03/24/18             Plan - 02/24/18 1158    Clinical Impression Statement  Pt presents to PT with R UE lymphedema mainly at forearm, right breast and lateral trunk swelling and decreased R shoulder ROM following treatment for breast cancer. Pt underwent a right  partial mastectomy and ALNB on 03/18/17. She had 24 lymph nodes removed and 22 were positive. She has completed radiation and chemotherapy. She works part times in [employer] and has to  lift heavy tables etc which she has been doing. She would benefit from skilled PT services to decrease RUE, trunk and breast lymphedema, assist pt with obtaining appropriate compression garments, increase right shoulder ROM and strength.     History and Personal Factors relevant to plan of care:  pt is right handed    Clinical Presentation  Evolving    Clinical Presentation due to:  pt just started developing swelling at forearm and her job requires her to do heavy lifting    Clinical Decision Making  Moderate    Rehab Potential  Good    Clinical Impairments Affecting Rehab Potential  hx of radiation    PT Frequency  2x / week    PT Duration  4 weeks    PT Treatment/Interventions  ADLs/Self Care Home Management;Therapeutic exercise;Therapeutic activities;Patient/family education;Manual techniques;Manual lymph drainage;Compression bandaging;Taping;Vasopneumatic Device;Passive range of motion;Scar mobilization    PT Next Visit Plan  give supine dowel exercises, AA/A/PROM To right shoulder, do MLD to RUE, breast and lateral trunk and issue handouts, see if signed Rx is back for compression garments, show pt example of new tribute wrap night time garment, apply bandages if needed to reduce swelling prior to getting measured    Consulted and Agree with Plan of Care  Patient       Patient will benefit from skilled therapeutic intervention in order to improve the following deficits and impairments:  Increased fascial restricitons, Pain, Decreased scar mobility, Decreased range of motion, Decreased strength, Increased edema  Visit Diagnosis: Lymphedema, not elsewhere classified  Stiffness of right shoulder, not elsewhere classified  Muscle weakness (generalized)  Disorder of the skin and subcutaneous tissue related to  radiation, unspecified     Problem List Patient Active Problem List   Diagnosis Date Noted  . IRRITABLE BOWEL SYNDROME 04/09/2009  . UTI 04/09/2009  . ABDOMINAL PAIN, GENERALIZED 04/09/2009  . HYPERLIPIDEMIA 04/08/2009  . HYPERTENSION 04/08/2009  . GERD 04/08/2009  . CHEST PAIN 04/08/2009    Milagros LollBlaire Breedlove [facility] 02/24/2018, 12:13 PM  [facility] [address] [place], [place], [id number] Phone: [phone]   Fax:  [phone]  Name: Andrea Mccann MRN: [id number] Date of Birth: 01/30/1951  Andrea Mccann, PT 02/24/18 12:13 PM

## 2018-03-02 ENCOUNTER — Ambulatory Visit: Payer: 59

## 2018-03-02 DIAGNOSIS — M25611 Stiffness of right shoulder, not elsewhere classified: Secondary | ICD-10-CM

## 2018-03-02 DIAGNOSIS — L599 Disorder of the skin and subcutaneous tissue related to radiation, unspecified: Secondary | ICD-10-CM

## 2018-03-02 DIAGNOSIS — M6281 Muscle weakness (generalized): Secondary | ICD-10-CM

## 2018-03-02 DIAGNOSIS — I890 Lymphedema, not elsewhere classified: Secondary | ICD-10-CM

## 2018-03-02 NOTE — Therapy (Signed)
[facility] [address] [place], [place], [address] Phone: [phone]   Fax:  [phone]  Physical Therapy Treatment  Patient Details  Name: Andrea Mccann MRN: [id number] Date of Birth: July 20, 1951 Referring Provider: Thom Chimes   Encounter Date: 03/02/2018  PT End of Session - 03/02/18 1612    Visit Number  2    Number of Visits  9    Date for PT Re-Evaluation  03/24/18    PT Start Time  1520    PT Stop Time  1608    PT Time Calculation (min)  48 min    Activity Tolerance  Patient tolerated treatment well    Behavior During Therapy  [facility] for tasks assessed/performed       History reviewed. No pertinent past medical history.  History reviewed. No pertinent surgical history.  There were no vitals filed for this visit.  Subjective Assessment - 03/02/18 1522    Subjective  Nothing really new since I was here for my evaluation.    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  No/denies                      [facility] PT Treatment/Exercise - 03/02/18 0001      Shoulder Exercises: Supine   External Rotation  AAROM;Right;5 reps with dowel for 5 sec holds    External Rotation Limitations  Demo and tactile cuing for technique    Flexion  AAROM;Both;5 reps with dowel for 5 sec    ABduction  AAROM;Right;5 reps with dowel for 5 sec      Manual Therapy   Manual Therapy  Manual Lymphatic Drainage (MLD);Passive ROM    Manual Lymphatic Drainage (MLD)  In Supine: Short neck, superficial and deep abdominals, Rt inguinal and Lt axillary nodes, Rt axillo-inguinal and anterior inter-axillary anastomosis then Rt UE from dorsal hand to lateral shoulder working from proximal to distal then retracing all steps, beginning to instruct pt throughout    Passive ROM  In  Supine: Rt shoulder flexion, abduction and D2 to pts tolerance (briefly at end of session as time ran out with instruction of all above)             PT Education - 03/02/18 1527    Education provided  Yes    Education Details  Supine dowel exercises; also began instruction for self manual lymph drainage (performed this on pt and briefly had her return as time allowed)    Person(s) Educated  Patient    Methods  Explanation;Demonstration;Handout    Comprehension  Verbalized understanding;Returned demonstration;Need further instruction          PT Long Term Goals - 02/24/18 1203      PT LONG TERM GOAL #1   Title  Pt will demonstrate 160 degrees of right shoulder flexion to allow her to reach overehad    Baseline  149    Time  4    Period  Weeks    Status  New    Target Date  03/24/18      PT LONG TERM GOAL #2   Title  Pt will demonstrate 160 degrees of right shoulder abduction to allow her to reach out to sides    Baseline  113    Time  4    Period  Weeks    Status  New    Target Date  03/24/18      PT LONG TERM GOAL #3   Title  Pt will obtain appropriate compression garments (compression bra, day and night time sleeve and glove) for long term management of lymphedema    Time  4    Period  Weeks    Status  New    Target Date  03/24/18      PT LONG TERM GOAL #4   Title  Pt will be independent with a home exercise program for continued strengthening and stretching    Time  4    Period  Weeks    Status  New    Target Date  03/24/18      PT LONG TERM GOAL #5   Title  Pt will be independent in self manual lymphatic drainage for long term management of lymphedema    Time  4    Period  Weeks    Status  New    Target Date  03/24/18            Plan - 03/02/18 1704    Clinical Impression Statement  Instructed pt in supine dowel exercises which she required mod VCs and demonstration for correct UE positioning throughout. Then educated pt on basics of lymphatic  system and principles of manual lymph drainage while performing. Pt was able to briefly return good demonstration of technique and wanted to begin trying this as home so issued handout though she will require further instruction and review. Also educated pt on day and nighttime compression garment options and she would like to start with trying a nighttime garment (Tribute Wrap) in conjuction with performing self MLD 2x/day to work towards possible reductions. She is open to eventually getting a day time garment if/when needed but not yet. So pt plans to make an appt at [facility] for this (order not yet returned, tried a different fax number).    Rehab Potential  Good    Clinical Impairments Affecting Rehab Potential  hx of radiation    PT Frequency  2x / week    PT Duration  4 weeks    PT Treatment/Interventions  ADLs/Self Care Home Management;Therapeutic exercise;Therapeutic activities;Patient/family education;Manual techniques;Manual lymph drainage;Compression bandaging;Taping;Vasopneumatic Device;Passive range of motion;Scar mobilization    PT Next Visit Plan  Review supine dowel exercises, AA/A/PROM To right shoulder, cont and have pt perform MLD to Rt UE, breast and lateral trunk, see if signed Rx is back for compression garments and if pt got measured for TributeWrap; apply bandages if needed to reduce swelling prior to getting measured for day time garments.    Consulted and Agree with Plan of Care  Patient       Patient will benefit from skilled therapeutic intervention in order to improve the following deficits and impairments:  Increased fascial restricitons, Pain, Decreased scar mobility, Decreased range of motion, Decreased strength, Increased edema  Visit Diagnosis: Lymphedema, not elsewhere classified  Stiffness of right shoulder, not elsewhere classified  Muscle weakness (generalized)  Disorder of the skin and subcutaneous tissue related to radiation,  unspecified     Problem List Patient Active Problem List   Diagnosis Date Noted  . IRRITABLE BOWEL SYNDROME 04/09/2009  . UTI 04/09/2009  . ABDOMINAL PAIN, GENERALIZED 04/09/2009  . HYPERLIPIDEMIA 04/08/2009  . HYPERTENSION 04/08/2009  . GERD 04/08/2009  . CHEST PAIN 04/08/2009    Hermenia Bersosenberger, Valerie Ann, PTA 03/02/2018, 5:13 PM    [facility] [address] [place], [place], [address] Phone: [phone]   Fax:  [phone]  Name: DELICIA BERENS MRN: [id number] Date of Birth: 1951-09-20

## 2018-03-02 NOTE — Patient Instructions (Addendum)
SHOULDER: Flexion - Supine (Cane)        Cancer Rehab [phone]    Hold cane in both hands. Raise arms up overhead. Do not allow back to arch. Hold _5__ seconds. Do __5-10__ times; __1-2__ times a day.   SELF ASSISTED WITH OBJECT: Shoulder Abduction / Adduction - Supine    Hold cane with both hands. Move both arms from side to side, keep elbows straight.  Hold when stretch felt for __5__ seconds. Repeat __5-10__ times; __1-2__ times a day. Once this becomes easier progress to third picture bringing affected arm towards ear by staying out to side. Same hold for _5_seconds. Repeat  _5-10_ times, _1-2_ times/day.  Shoulder Blade Stretch    Clasp fingers behind head with elbows touching in front of face. Pull elbows back while pressing shoulder blades together. Relax and hold as tolerated, can place pillow under elbow here for comfort as needed and to allow for prolonged stretch.  Repeat __5__ times. Do __1-2__ sessions per day.     SHOULDER: External Rotation - Supine (Cane)    Hold cane with both hands. Rotate arm away from body. Keep elbow on floor and next to body. _5-10__ reps per set, hold 5 seconds, _1-2__ sets per day. Add towel to keep elbow at side.  Copyright  VHI. All rights reserved.    Start with circles near neck, above collarbones 10 times.   Deep Effective Breath   Standing, sitting, or laying down, place both hands on the belly. Take a deep breath IN, expanding the belly; then breath OUT, contracting the belly. Repeat __5__ times. Do __2-3__ sessions per day and before your self massage.  Axilla to Axilla - Sweep   On uninvolved side make 5 circles in the armpit, then pump _5__ times from involved armpit across chest to uninvolved armpit, making a pathway. Do _1__ time per day.  Copyright  VHI. All rights reserved.  Axilla to Inguinal Nodes - Sweep   On involved side, make 5 circles at groin at panty line, then pump _5__ times from armpit along side  of trunk to outer hip, making your other pathway. Do __1_ time per day.  Copyright  VHI. All rights reserved.  Arm Posterior: Elbow to Shoulder - Sweep   Pump _5__ times from back of elbow to top of shoulder. Then inner to outer upper arm _5_ times, then outer arm again _5_ times. Then back to the pathways _2-3_ times. Do _1__ time per day.  Copyright  VHI. All rights reserved.  ARM: [place] Wrist to Elbow - Sweep   Pump or stationary circles _5__ times from wrist to elbow making sure to do both sides of the forearm. Then retrace your steps to the outer arm, and the pathways _2-3_ times each. Do _1__ time per day.  Copyright  VHI. All rights reserved.  ARM: Dorsum of Hand to Shoulder - Sweep   Pump or stationary circles _5__ times on back of hand including knuckle spaces and individual fingers if needed working up towards the wrist, then retrace all your steps working back up the forearm, doing both sides; upper outer arm and back to your pathways _2-3_ times each. Then do 5 circles again at uninvolved armpit and involved groin where you started! Good job!! Do __1_ time per day.

## 2018-03-07 ENCOUNTER — Ambulatory Visit: Payer: 59

## 2018-03-07 DIAGNOSIS — I890 Lymphedema, not elsewhere classified: Secondary | ICD-10-CM

## 2018-03-07 DIAGNOSIS — M25611 Stiffness of right shoulder, not elsewhere classified: Secondary | ICD-10-CM

## 2018-03-07 DIAGNOSIS — M6281 Muscle weakness (generalized): Secondary | ICD-10-CM

## 2018-03-07 NOTE — Therapy (Signed)
[facility] [address] [place], [place], [address] Phone: [phone]   Fax:  [phone]  Physical Therapy Treatment  Patient Details  Name: Andrea PeyerGail K Mccann MRN: [id number] Date of Birth: 06/01/1951 Referring Provider: Thom ChimesEllen Jones   Encounter Date: 03/07/2018  PT End of Session - 03/07/18 0848    Visit Number  3    Number of Visits  9    Date for PT Re-Evaluation  03/24/18    PT Start Time  0804    PT Stop Time  0847    PT Time Calculation (min)  43 min    Activity Tolerance  Patient tolerated treatment well    Behavior During Therapy  [facility]       History reviewed. No pertinent past medical history.  History reviewed. No pertinent surgical history.  There were no vitals filed for this visit.  Subjective Assessment - 03/07/18 0807    Subjective  I did the exercises you showed me over the weekend and I feel like they are helping my rt shoulder ROM. I did the massage a few times but definietly need to review that.     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  No/denies                      [facility] Treatment/Exercise - 03/07/18 0001      Shoulder Exercises: Supine   External Rotation  AAROM;Right;5 reps with dowel for 5 sec holds    External Rotation Limitations  Pt needed min VCs for review of technique for all supine exercise with dowel    Flexion  AAROM;Both;5 reps with dowel 5 sec holds    ABduction  AAROM;Right;5 reps with dowel for 5 sec holds      Shoulder Exercises: Pulleys   Flexion  1 minute    Flexion Limitations  Pt with good ROM so only did 1 min    ABduction  1 minute      Shoulder Exercises: Therapy Ball   Flexion  5 reps With forward  lean into end of stretch    ABduction  Right;5 reps Same side lean into end of stretch      Manual Therapy   Manual Therapy  Manual Lymphatic Drainage (MLD)    Manual Lymphatic Drainage (MLD)  In Supine: Short neck, 5 diaphragmatic breathing, Rt inguinal and Lt axillary nodes, Rt axillo-inguinal and anterior inter-axillary anastomosis then Rt UE from dorsal hand to lateral shoulder working from proximal to distal then retracing all steps, with reviewing with pt throughout and having her return demonstration at each sequence.    Passive ROM  --                  PT Long Term Goals - 02/24/18 1203      PT LONG TERM GOAL #1   Title  Pt will demonstrate 160 degrees of right shoulder flexion to allow her to reach overehad    Baseline  149    Time  4    Period  Weeks    Status  New    Target Date  03/24/18      PT LONG TERM GOAL #2   Title  Pt will demonstrate 160 degrees of right shoulder abduction to allow her to reach out to sides    Baseline  113    Time  4    Period  Weeks    Status  New    Target Date  03/24/18      PT LONG TERM GOAL #3   Title  Pt will obtain appropriate compression garments (compression bra, day and night time sleeve and glove) for long term management of lymphedema    Time  4    Period  Weeks    Status  New    Target Date  03/24/18      PT LONG TERM GOAL #4   Title  Pt will be independent with a home exercise program for continued strengthening and stretching    Time  4    Period  Weeks    Status  New    Target Date  03/24/18      PT LONG TERM GOAL #5   Title  Pt will be independent in self manual lymphatic drainage for long term management of lymphedema    Time  4    Period  Weeks    Status  New    Target Date  03/24/18            Plan - 03/07/18 0848    Clinical Impression Statement  Reviewed briefly supine dowel exercises which pt did well with once min VCs and demo for correct technique (she was mostly just doing scaption and  not each individual flexion and then abduction). Began pt with AA/ROM pulleys and ball on wall but she repors her stretching isn't primary concern as she now has exericses to focus on for this at home and wants to focus on manual lymph drainage. Review with this overall went well but pt furhter into sequence more confused pt became, but not discouraged. Encouraged her to just cont to point she is comfortable with at home and we will continue with instruction here.     Rehab Potential  Good    Clinical Impairments Affecting Rehab Potential  hx of radiation    PT Frequency  2x / week    PT Duration  4 weeks    PT Treatment/Interventions  ADLs/Self Care Home Management;Therapeutic exercise;Therapeutic activities;Patient/family education;Manual techniques;Manual lymph drainage;Compression bandaging;Taping;Vasopneumatic Device;Passive range of motion;Scar mobilization    PT Next Visit Plan  Focus now mostly on and having pt perform MLD to Rt UE, breast and lateral trunk, see if signed Rx is back for compression garments and if pt got measured for TributeWrap; apply bandages if needed to reduce swelling prior to getting measured for day time garments.    Consulted and Agree with Plan of Care  Patient       Patient will benefit from skilled therapeutic intervention in order to improve the following deficits and impairments:  Increased fascial restricitons, Pain, Decreased scar mobility, Decreased range of motion, Decreased strength, Increased edema  Visit Diagnosis: Lymphedema, not elsewhere classified  Stiffness of right shoulder, not elsewhere classified  Muscle weakness (generalized)     Problem List Patient Active Problem List   Diagnosis Date Noted  . IRRITABLE BOWEL SYNDROME 04/09/2009  . UTI 04/09/2009  . ABDOMINAL PAIN, GENERALIZED 04/09/2009  . HYPERLIPIDEMIA 04/08/2009  . HYPERTENSION 04/08/2009  . GERD 04/08/2009  . CHEST PAIN 04/08/2009    Hermenia Bers,  PTA 03/07/2018, 9:01 AM  [facility] [address] [place], [place],  [address] Phone: [phone]   Fax:  [phone]  Name: Andrea Mccann MRN: [id number] Date of Birth: 10/10/1951

## 2018-03-09 ENCOUNTER — Encounter: Payer: 59 | Admitting: Physical Therapy

## 2018-03-09 ENCOUNTER — Ambulatory Visit: Payer: 59 | Admitting: Physical Therapy

## 2018-03-09 DIAGNOSIS — I890 Lymphedema, not elsewhere classified: Secondary | ICD-10-CM

## 2018-03-09 NOTE — Therapy (Signed)
[facility] [address] [place], [place], [address] Phone: [phone]   Fax:  [phone]  Physical Therapy Treatment  Patient Details  Name: Andrea Mccann MRN: [id number] Date of Birth: 09-12-1951 Referring Provider: Thom Chimes   Encounter Date: 03/09/2018  PT End of Session - 03/09/18 2051    Visit Number  4    Number of Visits  9    Date for PT Re-Evaluation  03/24/18    PT Start Time  1522    PT Stop Time  1602    PT Time Calculation (min)  40 min    Activity Tolerance  Patient tolerated treatment well    Behavior During Therapy  [facility] for tasks assessed/performed       No past medical history on file.  No past surgical history on file.  There were no vitals filed for this visit.  Subjective Assessment - 03/09/18 1523    Subjective  Doing fine today. "I just got my sleeve today at [facility], and I ordered the nighttime garment." Doesn't have the prescription yet.    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  No/denies                      [facility] Adult PT Treatment/Exercise - 03/09/18 0001      Manual Therapy   Manual Lymphatic Drainage (MLD)  Pt. performed manual lymph drainage as follows, with cueing and correction by therapist: 5 diaphragmatic breaths, supraclavicular fossae, left axilla and right groin; anterior interaxillary anastomosis and right axillo-inguinal anastomosis (including lateral breast); right UE from shoulder working proximal to distal, then retracing steps.  Then therapist performed the same to reinforce sequence and technique.              PT Education - 03/09/18 2051    Education provided  Yes    Education Details  self manual lymph drainage    Person(s) Educated  Patient    Methods  Explanation;Tactile cues;Verbal cues    Comprehension  Verbalized understanding;Returned demonstration;Need further instruction          PT Long Term Goals - 02/24/18 1203      PT LONG TERM GOAL #1   Title  Pt will demonstrate 160 degrees of right shoulder flexion to allow her to reach overehad    Baseline  149    Time  4    Period  Weeks    Status  New    Target Date  03/24/18      PT LONG TERM GOAL #2   Title  Pt will demonstrate 160 degrees of right shoulder abduction to allow her to reach out to sides    Baseline  113    Time  4    Period  Weeks    Status  New    Target Date  03/24/18      PT LONG TERM GOAL #3   Title  Pt will obtain appropriate compression garments (compression bra, day and night time sleeve and glove) for long term management of lymphedema    Time  4    Period  Weeks    Status  New    Target Date  03/24/18  PT LONG TERM GOAL #4   Title  Pt will be independent with a home exercise program for continued strengthening and stretching    Time  4    Period  Weeks    Status  New    Target Date  03/24/18      PT LONG TERM GOAL #5   Title  Pt will be independent in self manual lymphatic drainage for long term management of lymphedema    Time  4    Period  Weeks    Status  New    Target Date  03/24/18            Plan - 03/09/18 2052    Clinical Impression Statement  Pt. remembered quite a few of the steps of self-manual lymph drainage, but did need correction on a number of steps as well.  She does not feel confident with it yet. Pt. reports she did get a daytime compression garment and ordered a nighttime garment, though prescription has not been returned.    Rehab Potential  Good    Clinical Impairments Affecting Rehab Potential  hx of radiation    PT Frequency  2x / week    PT Duration  4 weeks    PT Treatment/Interventions  ADLs/Self Care Home Management;Therapeutic exercise;Therapeutic  activities;Patient/family education;Manual techniques;Manual lymph drainage;Compression bandaging;Taping;Vasopneumatic Device;Passive range of motion;Scar mobilization    PT Next Visit Plan  Focus now mostly on and having pt perform MLD to Rt UE, breast and lateral trunk, see if signed Rx is back for compression garment;    Consulted and Agree with Plan of Care  Patient       Patient will benefit from skilled therapeutic intervention in order to improve the following deficits and impairments:  Increased fascial restricitons, Pain, Decreased scar mobility, Decreased range of motion, Decreased strength, Increased edema  Visit Diagnosis: Lymphedema, not elsewhere classified     Problem List Patient Active Problem List   Diagnosis Date Noted  . IRRITABLE BOWEL SYNDROME 04/09/2009  . UTI 04/09/2009  . ABDOMINAL PAIN, GENERALIZED 04/09/2009  . HYPERLIPIDEMIA 04/08/2009  . HYPERTENSION 04/08/2009  . GERD 04/08/2009  . CHEST PAIN 04/08/2009    SALISBURY,DONNA 03/09/2018, 8:55 PM  [facility] [address] [place], [place], [address] Phone: [phone]   Fax:  [phone]  Name: Andrea Mccann MRN: [id number] Date of Birth: 09/29/1951  Micheline MazeDonna Salisbury, PT 03/09/18 8:55 PM

## 2018-03-10 ENCOUNTER — Encounter: Payer: 59 | Admitting: Physical Therapy

## 2018-03-10 ENCOUNTER — Ambulatory Visit: Payer: 59 | Admitting: Physical Therapy

## 2018-03-14 ENCOUNTER — Encounter: Payer: 59 | Admitting: Physical Therapy

## 2018-03-15 ENCOUNTER — Encounter: Payer: 59 | Admitting: Physical Therapy

## 2018-03-16 ENCOUNTER — Encounter: Payer: Self-pay | Admitting: Physical Therapy

## 2018-03-16 ENCOUNTER — Ambulatory Visit: Payer: 59 | Admitting: Physical Therapy

## 2018-03-16 DIAGNOSIS — I890 Lymphedema, not elsewhere classified: Secondary | ICD-10-CM

## 2018-03-16 NOTE — Therapy (Signed)
[facility] [address] [place], [place], [address] Phone: [phone]   Fax:  [phone]  Physical Therapy Treatment  Patient Details  Name: Andrea Mccann MRN: [id number] Date of Birth: 21-Mar-1951 Referring Provider: Thom Chimes   Encounter Date: 03/16/2018  PT End of Session - 03/16/18 0845    Visit Number  5    Number of Visits  9    Date for PT Re-Evaluation  03/24/18    PT Start Time  0805    PT Stop Time  0850    PT Time Calculation (min)  45 min    Activity Tolerance  Patient tolerated treatment well    Behavior During Therapy  [facility] for tasks assessed/performed       History reviewed. No pertinent past medical history.  History reviewed. No pertinent surgical history.  There were no vitals filed for this visit.  Subjective Assessment - 03/16/18 0807    Subjective  I pulled my back out and that was why I missed my last appointment. I did icing. I have done this before. I just started the heat on it.     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  Yes    Pain Score  5     Pain Location  Back    Pain Orientation  Lower    Pain Descriptors / Indicators  Sore                No data recorded       [facility] Adult PT Treatment/Exercise - 03/16/18 0001      Manual Therapy   Manual Lymphatic Drainage (MLD)  Pt. performed manual lymph drainage as follows, with cueing and correction by therapist: 5 diaphragmatic breaths, supraclavicular fossae, left axilla and right groin; anterior interaxillary anastomosis and right axillo-inguinal anastomosis, right breast; right UE from shoulder working proximal to distal, then retracing steps.                  PT Long Term Goals - 03/16/18  0809      PT LONG TERM GOAL #1   Title  Pt will demonstrate 160 degrees of right shoulder flexion to allow her to reach overehad    Baseline  149, 03/16/18- 149    Time  4    Period  Weeks    Status  On-going      PT LONG TERM GOAL #2   Title  Pt will demonstrate 160 degrees of right shoulder abduction to allow her to reach out to sides    Baseline  113, 03/16/18- 156    Time  4    Period  Weeks    Status  On-going      PT LONG TERM GOAL #3   Title  Pt will obtain appropriate compression garments (compression bra, day and night time sleeve and glove) for long term management of lymphedema    Baseline  03/16/18- pt has obtained her sleeve and glove and is awaiting arrival of night time garment- going to get measured for bra    Time  4    Period  Weeks    Status  On-going      PT LONG TERM GOAL #4   Title  Pt  will be independent with a home exercise program for continued strengthening and stretching    Time  4    Period  Weeks    Status  On-going      PT LONG TERM GOAL #5   Title  Pt will be independent in self manual lymphatic drainage for long term management of lymphedema    Baseline  03/16/18- pt is still requiring assist with this    Time  4    Period  Weeks    Status  On-going            Plan - 03/16/18 0845    Clinical Impression Statement  Continued re educating pt on proper manual lymphatic drainage technique and had pt demonstrate correct technique. Pt still required cueing for correct sequencing and hand placement but is progressing towards independence. She plans on ordering a night time compression glove today. Issued pt Rx to take to DME provider for garments.     Rehab Potential  Good    Clinical Impairments Affecting Rehab Potential  hx of radiation    PT Frequency  2x / week    PT Duration  4 weeks    PT Treatment/Interventions  ADLs/Self Care Home Management;Therapeutic exercise;Therapeutic activities;Patient/family education;Manual techniques;Manual lymph  drainage;Compression bandaging;Taping;Vasopneumatic Device;Passive range of motion;Scar mobilization    PT Next Visit Plan  Focus now mostly on and having pt perform MLD to Rt UE, breast and lateral trunk, work on R shoulder ROM once she is indep in MLD    Consulted and Agree with Plan of Care  Patient       Patient will benefit from skilled therapeutic intervention in order to improve the following deficits and impairments:  Increased fascial restricitons, Pain, Decreased scar mobility, Decreased range of motion, Decreased strength, Increased edema  Visit Diagnosis: Lymphedema, not elsewhere classified     Problem List Patient Active Problem List   Diagnosis Date Noted  . IRRITABLE BOWEL SYNDROME 04/09/2009  . UTI 04/09/2009  . ABDOMINAL PAIN, GENERALIZED 04/09/2009  . HYPERLIPIDEMIA 04/08/2009  . HYPERTENSION 04/08/2009  . GERD 04/08/2009  . CHEST PAIN 04/08/2009    Milagros LollBlaire Breedlove [facility] 03/16/2018, 11:09 AM  [facility] [address] [place], [place], [address] Phone: [phone]   Fax:  [phone]  Name: Vincent PeyerGail K Laroche MRN: [id number] Date of Birth: 02/16/1951  Leonette MostBlaire Breedlove Blue, PT 03/16/18 11:09 AM

## 2018-03-21 ENCOUNTER — Ambulatory Visit: Payer: 59 | Attending: Radiation Oncology | Admitting: Physical Therapy

## 2018-03-21 ENCOUNTER — Encounter: Payer: Self-pay | Admitting: Physical Therapy

## 2018-03-21 DIAGNOSIS — I890 Lymphedema, not elsewhere classified: Secondary | ICD-10-CM | POA: Diagnosis not present

## 2018-03-21 DIAGNOSIS — M25611 Stiffness of right shoulder, not elsewhere classified: Secondary | ICD-10-CM | POA: Insufficient documentation

## 2018-03-21 NOTE — Therapy (Signed)
[facility] [address] [place], [place], [address] Phone: [phone]   Fax:  [phone]  Physical Therapy Treatment  Patient Details  Name: Andrea Mccann MRN: [id number] Date of Birth: 05/26/1951 Referring Provider: Thom ChimesEllen Jones   Encounter Date: 03/21/2018  PT End of Session - 03/21/18 0900    Visit Number  6    Number of Visits  9    Date for PT Re-Evaluation  03/24/18    PT Start Time  0805    PT Stop Time  0851    PT Time Calculation (min)  46 min    Activity Tolerance  Patient tolerated treatment well    Behavior During Therapy  [facility] for tasks assessed/performed       History reviewed. No pertinent past medical history.  History reviewed. No pertinent surgical history.  There were no vitals filed for this visit.  Subjective Assessment - 03/21/18 0806    Subjective  Massage is going well. I think I am doing it right. My back is getting better but it is still sore in the morning.     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  Yes    Pain Score  4     Pain Location  Back    Pain Orientation  Lower    Pain Descriptors / Indicators  Sore                No data recorded       [facility] Adult PT Treatment/Exercise - 03/21/18 0001      Manual Therapy   Manual Therapy  Passive ROM;Manual Lymphatic Drainage (MLD);Myofascial release    Myofascial Release  cross hands to right axilla in area of tightness, scar mobilization to right scar    Manual Lymphatic Drainage (MLD)  Pt. performed manual lymph drainage as follows, with cueing and correction by therapist: 5 diaphragmatic breaths, supraclavicular fossae, left axilla and right groin; anterior interaxillary anastomosis and right  axillo-inguinal anastomosis, right breast; right UE from shoulder working proximal to distal, then retracing steps.    Passive ROM  to right shoulder in to abduction                   PT Long Term Goals - 03/16/18 0809      PT LONG TERM GOAL #1   Title  Pt will demonstrate 160 degrees of right shoulder flexion to allow her to reach overehad    Baseline  149, 03/16/18- 149    Time  4    Period  Weeks    Status  On-going      PT LONG TERM GOAL #2   Title  Pt will demonstrate 160 degrees of right shoulder abduction to allow her to reach out to sides    Baseline  113, 03/16/18- 156    Time  4    Period  Weeks    Status  On-going      PT LONG TERM GOAL #3   Title  Pt will obtain appropriate compression garments (compression bra, day and night time sleeve and glove) for long term management of lymphedema    Baseline  03/16/18- pt has obtained her sleeve and glove and is awaiting arrival of night time  garment- going to get measured for bra    Time  4    Period  Weeks    Status  On-going      PT LONG TERM GOAL #4   Title  Pt will be independent with a home exercise program for continued strengthening and stretching    Time  4    Period  Weeks    Status  On-going      PT LONG TERM GOAL #5   Title  Pt will be independent in self manual lymphatic drainage for long term management of lymphedema    Baseline  03/16/18- pt is still requiring assist with this    Time  4    Period  Weeks    Status  On-going            Plan - 03/21/18 0901    Clinical Impression Statement  Assessed pt's independence with self MLD again today. Pt required very little cueing today and demonstrates great technique and speed. She plans on getting measured for a compression bra soon and is awaiting arrival of her night time compression garments. Will begin focusing on increasing right shoulder ROM and decreasing scar tissue and began that today.     Rehab Potential  Good    Clinical Impairments  Affecting Rehab Potential  hx of radiation    PT Frequency  2x / week    PT Duration  4 weeks    PT Treatment/Interventions  ADLs/Self Care Home Management;Therapeutic exercise;Therapeutic activities;Patient/family education;Manual techniques;Manual lymph drainage;Compression bandaging;Taping;Vasopneumatic Device;Passive range of motion;Scar mobilization    PT Next Visit Plan  focus on increasing shoulder ROM and decreasing scar tissue, myofascial to right axilla, pulleys, ball, having pt perform MLD to Rt UE prn, breast and lateral trunk    Consulted and Agree with Plan of Care  Patient       Patient will benefit from skilled therapeutic intervention in order to improve the following deficits and impairments:  Increased fascial restricitons, Pain, Decreased scar mobility, Decreased range of motion, Decreased strength, Increased edema  Visit Diagnosis: Lymphedema, not elsewhere classified  Stiffness of right shoulder, not elsewhere classified     Problem List Patient Active Problem List   Diagnosis Date Noted  . IRRITABLE BOWEL SYNDROME 04/09/2009  . UTI 04/09/2009  . ABDOMINAL PAIN, GENERALIZED 04/09/2009  . HYPERLIPIDEMIA 04/08/2009  . HYPERTENSION 04/08/2009  . GERD 04/08/2009  . CHEST PAIN 04/08/2009    Milagros Loll [facility] 03/21/2018, 9:03 AM  [facility] [address] [place], [place], [address] Phone: [phone]   Fax:  [phone]  Name: Andrea Mccann MRN: [id number] Date of Birth: October 28, 1951  Leonette Most, PT 03/21/18 9:03 AM

## 2018-03-23 ENCOUNTER — Ambulatory Visit: Payer: 59

## 2018-03-23 DIAGNOSIS — M25611 Stiffness of right shoulder, not elsewhere classified: Secondary | ICD-10-CM

## 2018-03-23 DIAGNOSIS — I890 Lymphedema, not elsewhere classified: Secondary | ICD-10-CM

## 2018-03-23 NOTE — Therapy (Signed)
[facility] [address] [place], [place], [id number] Phone: [phone]   Fax:  [phone]  Physical Therapy Treatment  Patient Details  Name: Andrea Mccann MRN: [id number] Date of Birth: 08/10/1951 Referring Provider: Thom ChimesEllen Jones   Encounter Date: 03/23/2018  PT End of Session - 03/23/18 0847    Visit Number  7    Number of Visits  9    Date for PT Re-Evaluation  03/24/18    PT Start Time  0804    PT Stop Time  0844    PT Time Calculation (min)  40 min    Activity Tolerance  Patient tolerated treatment well    Behavior During Therapy  [facility]       History reviewed. No pertinent past medical history.  History reviewed. No pertinent surgical history.  There were no vitals filed for this visit.  Subjective Assessment - 03/23/18 0809    Subjective  Doing the self manual lymph drainage every night and I really pleased with how my arm is doing/looking. Also been wearing the sleeve every day. My Rt shoulder ROM is my biggest complaint now.    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  No/denies                       [facility] - 03/23/18 0001      Manual Therapy   Manual Therapy  Passive ROM;Manual Lymphatic Drainage (MLD);Myofascial release;Soft tissue mobilization    Soft tissue mobilization  With lotion to posterior axilla where palpable muscle tightness    Myofascial Release  cross hands to right axilla in area of tightness, scar mobilization to right scar    Manual Lymphatic Drainage (MLD)  Manual lymph drainage as follows, 5 diaphragmatic breaths, supraclavicular fossae, left axilla and right groin; anterior interaxillary anastomosis and  right axillo-inguinal anastomosis, right breast; right UE from shoulder working proximal to distal, then retracing steps.    Passive ROM  to right shoulder in to abduction and D2                  PT Long Term Goals - 03/16/18 0809      PT LONG TERM GOAL #1   Title  Pt will demonstrate 160 degrees of right shoulder flexion to allow her to reach overehad    Baseline  149, 03/16/18- 149    Time  4    Period  Weeks    Status  On-going      PT LONG TERM GOAL #2   Title  Pt will demonstrate 160 degrees of right shoulder abduction to allow her to reach out to sides    Baseline  113, 03/16/18- 156    Time  4    Period  Weeks    Status  On-going      PT LONG TERM GOAL #3   Title  Pt will obtain appropriate compression garments (compression bra, day and night time sleeve and glove) for long term management of lymphedema    Baseline  03/16/18- pt has obtained her sleeve and glove and is awaiting arrival of night time garment- going to get measured for bra    Time  4  Period  Weeks    Status  On-going      PT LONG TERM GOAL #4   Title  Pt will be independent with a home exercise program for continued strengthening and stretching    Time  4    Period  Weeks    Status  On-going      PT LONG TERM GOAL #5   Title  Pt will be independent in self manual lymphatic drainage for long term management of lymphedema    Baseline  03/16/18- pt is still requiring assist with this    Time  4    Period  Weeks    Status  On-going            Plan - 03/23/18 0847    Clinical Impression Statement  Continued with focus to Rt shoulder ROM and decreasing scar tissue at axilla. Pt tolerated this well and softening of tissue noted by end of session by pt and therapist.     Rehab Potential  Good    Clinical Impairments Affecting Rehab Potential  hx of radiation    PT Frequency  2x / week    PT Duration  4 weeks    PT Treatment/Interventions  ADLs/Self Care Home Management;Therapeutic  exercise;Therapeutic activities;Patient/family education;Manual techniques;Manual lymph drainage;Compression bandaging;Taping;Vasopneumatic Device;Passive range of motion;Scar mobilization    PT Next Visit Plan  Renewal or D/C needed at next visit; focus on manual therapy for increasing shoulder ROM and decreasing scar tissue, myofascial to right axilla, having pt perform MLD to Rt UE prn, breast and lateral trunk prn    Consulted and Agree with Plan of Care  Patient       Patient will benefit from skilled therapeutic intervention in order to improve the following deficits and impairments:  Increased fascial restricitons, Pain, Decreased scar mobility, Decreased range of motion, Decreased strength, Increased edema  Visit Diagnosis: Lymphedema, not elsewhere classified  Stiffness of right shoulder, not elsewhere classified     Problem List Patient Active Problem List   Diagnosis Date Noted  . IRRITABLE BOWEL SYNDROME 04/09/2009  . UTI 04/09/2009  . ABDOMINAL PAIN, GENERALIZED 04/09/2009  . HYPERLIPIDEMIA 04/08/2009  . HYPERTENSION 04/08/2009  . GERD 04/08/2009  . CHEST PAIN 04/08/2009    Hermenia Bers, PTA 03/23/2018, 8:49 AM  [facility] [address] [place], [place], [address] Phone: [phone]   Fax:  [phone]  Name: Andrea Mccann MRN: [id number] Date of Birth: 12-Nov-1951

## 2018-03-28 ENCOUNTER — Ambulatory Visit: Payer: 59

## 2018-03-28 DIAGNOSIS — I890 Lymphedema, not elsewhere classified: Secondary | ICD-10-CM | POA: Diagnosis not present

## 2018-03-28 DIAGNOSIS — M25611 Stiffness of right shoulder, not elsewhere classified: Secondary | ICD-10-CM

## 2018-03-28 NOTE — Therapy (Addendum)
[place], [place], [address] Phone: [phone]   Fax:  [phone]  Physical Therapy Treatment  Patient Details  Name: MARASIA NEWHALL MRN: [id number] Date of Birth: 04-Jul-1951 Referring Provider: Ladona Horns   Encounter Date: 03/28/2018  PT End of Session - 03/28/18 0933    Visit Number  8    Number of Visits  9    Date for PT Re-Evaluation  03/24/18    PT Start Time  0843    PT Stop Time  0928    PT Time Calculation (min)  45 min    Activity Tolerance  Patient tolerated treatment well    Behavior During Therapy  [facility] for tasks assessed/performed       History reviewed. No pertinent past medical history.  History reviewed. No pertinent surgical history.  There were no vitals filed for this visit.  Subjective Assessment - 03/28/18 0846    Subjective  Still doing the self manual lymph drainage daily and wearing my compression sleeve daily. Had furniture market last week so worked harder and my arm did good I think.     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  No/denies         [facility] PT Assessment - 03/28/18 0001      AROM   Right Shoulder Flexion  145 Degrees    Right Shoulder ABduction  154 Degrees    Right Shoulder External Rotation  84 Degrees        LYMPHEDEMA/ONCOLOGY QUESTIONNAIRE - 03/28/18 0848      Right Upper Extremity Lymphedema   15 cm Proximal to Olecranon Process  29.7 cm    10 cm Proximal to Olecranon Process  29.5 cm    Olecranon Process  28 cm    15 cm Proximal to Ulnar Styloid Process  27.7 cm    10 cm Proximal to Ulnar Styloid Process  25 cm    Just Proximal to Ulnar Styloid Process  17.7 cm    Across Hand at [employer]  20.7 cm    At Bend of  2nd Digit  6.9 cm                [facility] Adult PT Treatment/Exercise - 03/28/18 0001      Manual Therapy   Manual Therapy  Passive ROM;Manual Lymphatic Drainage (MLD);Myofascial release;Soft tissue mobilization    Myofascial Release  cross hands to right axilla in area of tightness, scar mobilization to right scar    Manual Lymphatic Drainage (MLD)  Manual lymph drainage as follows, 5 diaphragmatic breaths, supraclavicular fossae, left axilla and right groin; anterior interaxillary anastomosis and right axillo-inguinal anastomosis, right breast; right UE from shoulder working proximal to distal, then retracing steps.    Passive ROM  to right shoulder in to abduction and D2                  PT Long Term Goals - 03/28/18 0844      PT LONG TERM GOAL #1   Title  Pt will demonstrate 160 degrees of right shoulder flexion to allow her to reach overehad    Baseline  149, 03/16/18- 149; 145 degrees (pt reports not feeling limited by this ROM  with function)-03/28/18    Status  Not Met      PT LONG TERM GOAL #2   Title  Pt will demonstrate 160 degrees of right shoulder abduction to allow her to reach out to sides    Baseline  113, 03/16/18- 154 degrees(pt reports no functional deficits with this ROM with ADLs)-03/28/18    Status  Partially Met      PT LONG TERM GOAL #3   Title  Pt will obtain appropriate compression garments (compression bra, day and night time sleeve and glove) for long term management of lymphedema    Baseline  03/16/18- pt has obtained her sleeve and glove and is awaiting arrival of night time garment- going to get measured for bra; Pt is getting her nighttime compression garment in next day or so and will get measured for compression bra this week or next, wears daytime sleeve daily-03/28/18    Status  Achieved      PT LONG TERM GOAL #4   Title  Pt will be independent with a home exercise program for continued strengthening and stretching    Status  Achieved       PT LONG TERM GOAL #5   Title  Pt will be independent in self manual lymphatic drainage for long term management of lymphedema    Baseline  03/16/18- pt is still requiring assist with this; pt independent with this and performing daily-03/28/18    Status  Achieved            Plan - 03/28/18 0933    Clinical Impression Statement  Pt has done very well with this episode of care and though she didn't meet her A/ROM goals, her abduction did greatly improved and she reports no limitations with her function. Pt ready for D/C at this time.     Rehab Potential  Good    Clinical Impairments Affecting Rehab Potential  hx of radiation    PT Frequency  2x / week    PT Duration  4 weeks    PT Treatment/Interventions  ADLs/Self Care Home Management;Therapeutic exercise;Therapeutic activities;Patient/family education;Manual techniques;Manual lymph drainage;Compression bandaging;Taping;Vasopneumatic Device;Passive range of motion;Scar mobilization    PT Next Visit Plan  D/C this visit.    Consulted and Agree with Plan of Care  Patient       Patient will benefit from skilled therapeutic intervention in order to improve the following deficits and impairments:  Increased fascial restricitons, Pain, Decreased scar mobility, Decreased range of motion, Decreased strength, Increased edema  Visit Diagnosis: Lymphedema, not elsewhere classified  Stiffness of right shoulder, not elsewhere classified     Problem List Patient Active Problem List   Diagnosis Date Noted  . IRRITABLE BOWEL SYNDROME 04/09/2009  . UTI 04/09/2009  . ABDOMINAL PAIN, GENERALIZED 04/09/2009  . HYPERLIPIDEMIA 04/08/2009  . HYPERTENSION 04/08/2009  . GERD 04/08/2009  . CHEST PAIN 04/08/2009    Otelia Limes, PTA 03/28/2018, 9:34 AM  [place], [place], [address] Phone: [phone]   Fax:  [phone]  Name: JODEAN VALADE MRN:  [id number] Date of Birth: December 03, 1951  PHYSICAL THERAPY DISCHARGE SUMMARY  Visits from Start of Care: 8  Current functional level related to goals / functional outcomes: See above   Remaining deficits: Still decreased shoulder ROM but pt reports this does not limit her   Education / Equipment: HEP, self MLD Plan: Patient agrees to discharge.  Patient goals were partially met. Patient is being discharged due  to meeting the stated rehab goals.  ?????    [facility] [place], [place] 03/28/18 10:16 AM

## 2018-03-30 ENCOUNTER — Encounter: Payer: 59 | Admitting: Physical Therapy

## 2018-04-04 ENCOUNTER — Ambulatory Visit: Payer: 59

## 2018-04-08 ENCOUNTER — Encounter: Admit: 2018-04-08 | Discharge: 2018-04-09 | Payer: MEDICARE | Attending: Adult Health | Primary: Adult Health

## 2018-04-08 DIAGNOSIS — C50411 Malignant neoplasm of upper-outer quadrant of right female breast: Principal | ICD-10-CM

## 2018-04-08 DIAGNOSIS — Z171 Estrogen receptor negative status [ER-]: Secondary | ICD-10-CM

## 2018-05-26 ENCOUNTER — Encounter
Admit: 2018-05-26 | Discharge: 2018-05-27 | Payer: MEDICARE | Attending: Hematology & Oncology | Primary: Hematology & Oncology

## 2018-05-26 DIAGNOSIS — C50411 Malignant neoplasm of upper-outer quadrant of right female breast: Principal | ICD-10-CM

## 2018-05-26 DIAGNOSIS — Z171 Estrogen receptor negative status [ER-]: Secondary | ICD-10-CM

## 2018-08-15 ENCOUNTER — Encounter
Admit: 2018-08-15 | Discharge: 2018-08-20 | Payer: MEDICARE | Attending: Radiation Oncology | Primary: Radiation Oncology

## 2018-08-15 DIAGNOSIS — Z171 Estrogen receptor negative status [ER-]: Secondary | ICD-10-CM

## 2018-08-15 DIAGNOSIS — C50411 Malignant neoplasm of upper-outer quadrant of right female breast: Principal | ICD-10-CM

## 2018-09-21 ENCOUNTER — Other Ambulatory Visit: Payer: Self-pay | Admitting: Nurse Practitioner

## 2018-09-21 DIAGNOSIS — E2839 Other primary ovarian failure: Secondary | ICD-10-CM

## 2018-09-28 ENCOUNTER — Encounter: Admit: 2018-09-28 | Discharge: 2018-09-29 | Payer: MEDICARE

## 2018-09-28 DIAGNOSIS — Z171 Estrogen receptor negative status [ER-]: Secondary | ICD-10-CM

## 2018-09-28 DIAGNOSIS — C50411 Malignant neoplasm of upper-outer quadrant of right female breast: Principal | ICD-10-CM

## 2018-10-26 ENCOUNTER — Encounter: Admit: 2018-10-26 | Discharge: 2018-10-26 | Payer: MEDICARE

## 2018-10-26 DIAGNOSIS — Z171 Estrogen receptor negative status [ER-]: Secondary | ICD-10-CM

## 2018-10-26 DIAGNOSIS — C50411 Malignant neoplasm of upper-outer quadrant of right female breast: Principal | ICD-10-CM

## 2018-10-30 IMAGING — MG DIGITAL SCREENING BILATERAL MAMMOGRAM WITH CAD
4 series · 4 of 4 positions shown · non-contrast
Comparison: Previous exam(s).

CLINICAL DATA: Screening.

EXAM:
DIGITAL SCREENING BILATERAL MAMMOGRAM WITH CAD

[R CC]
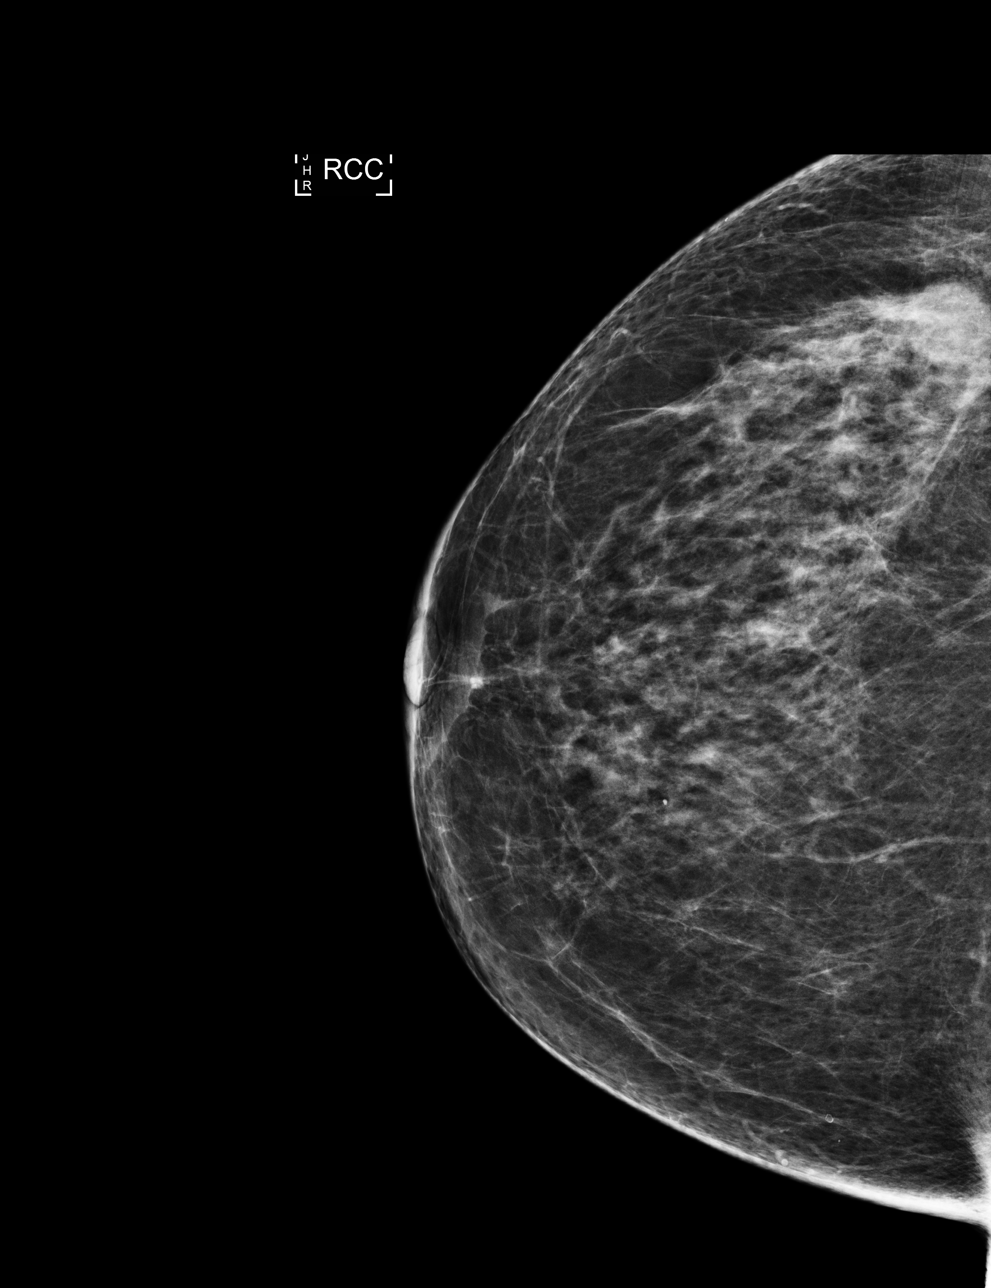

[R MLO]
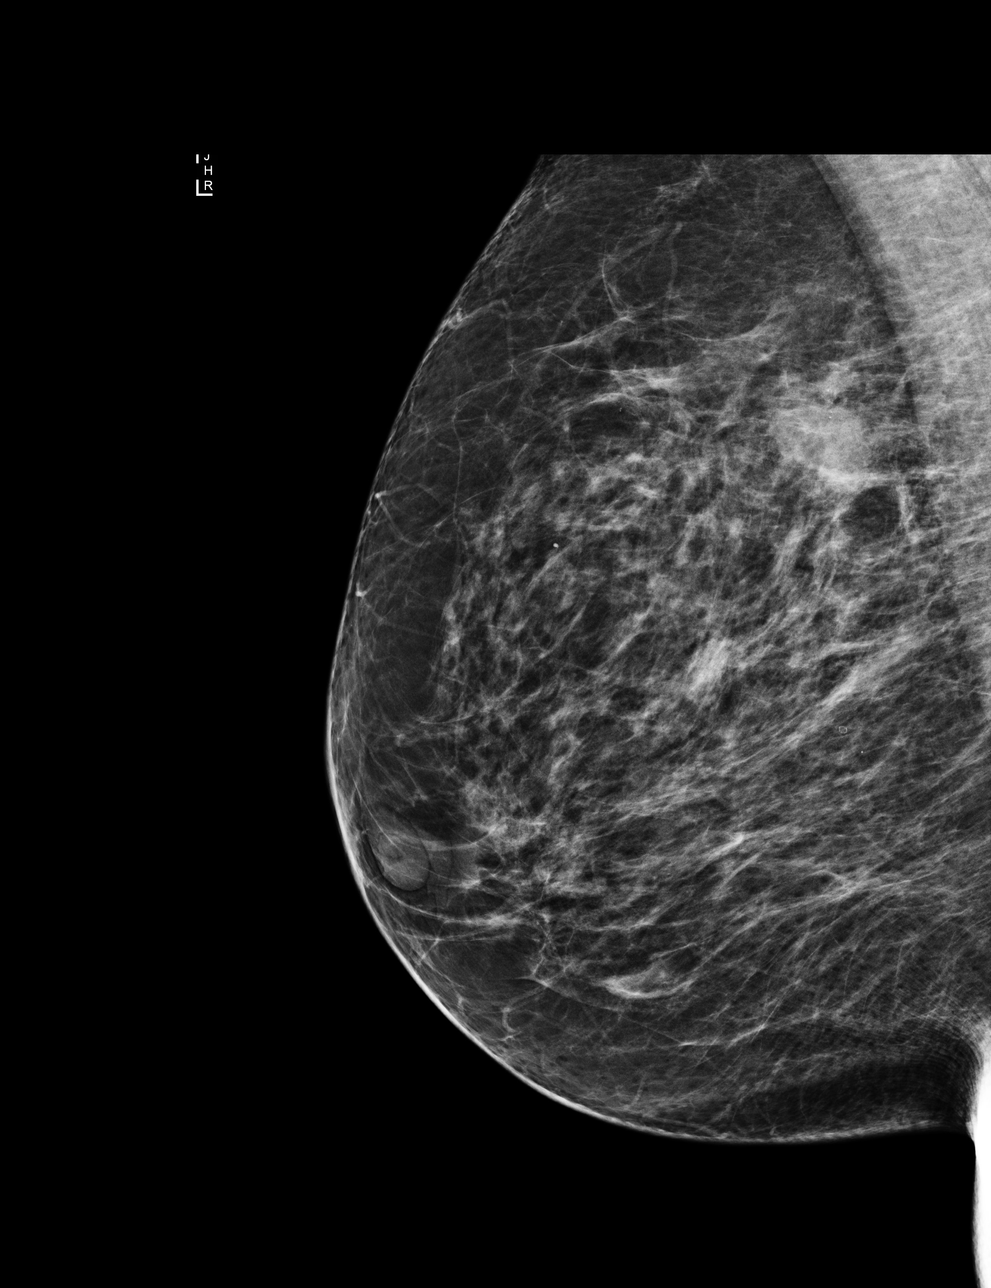

[L MLO]
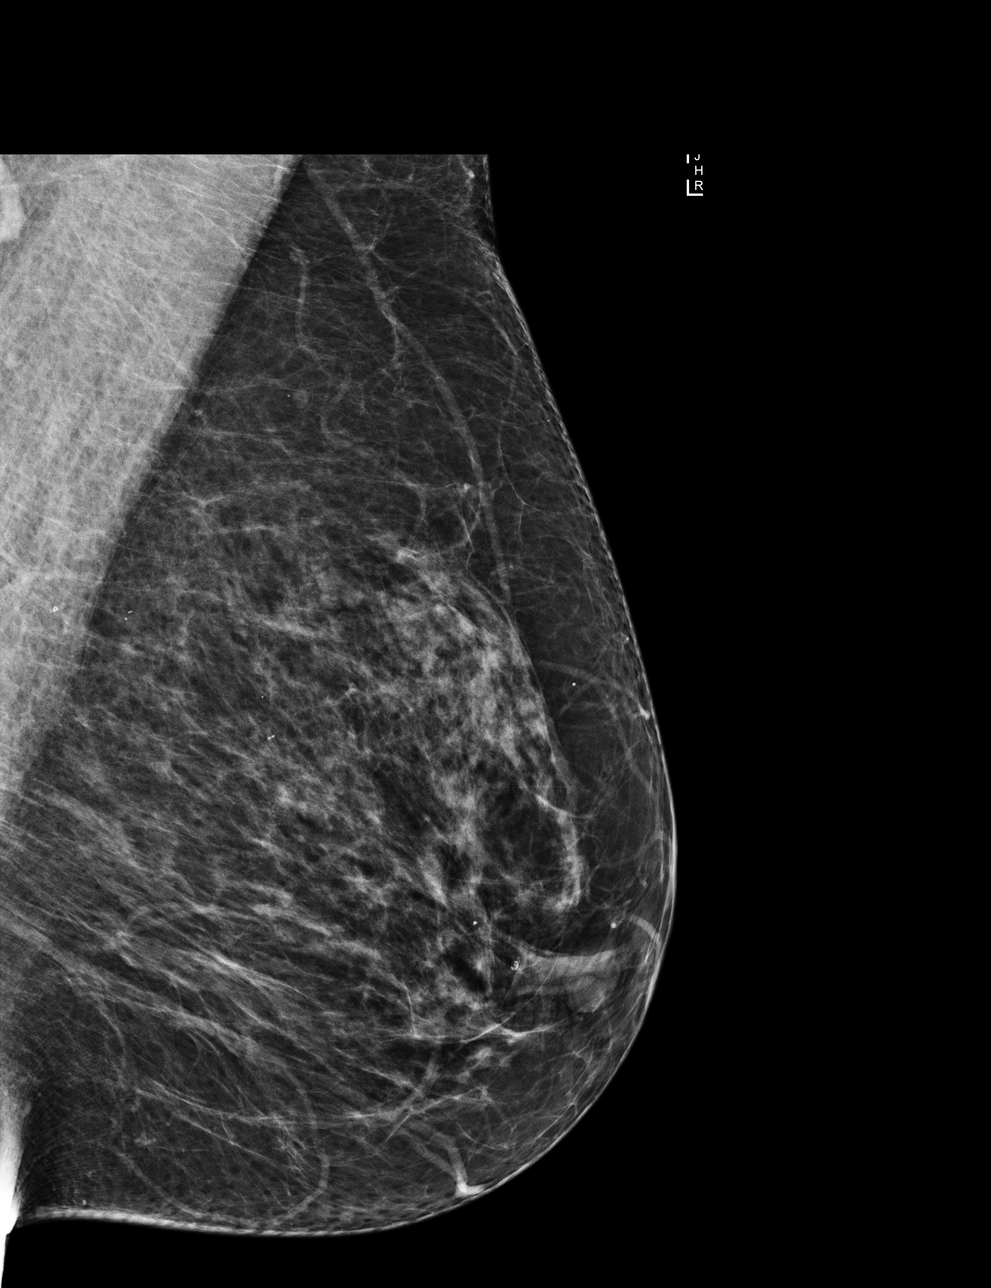

[L CC]
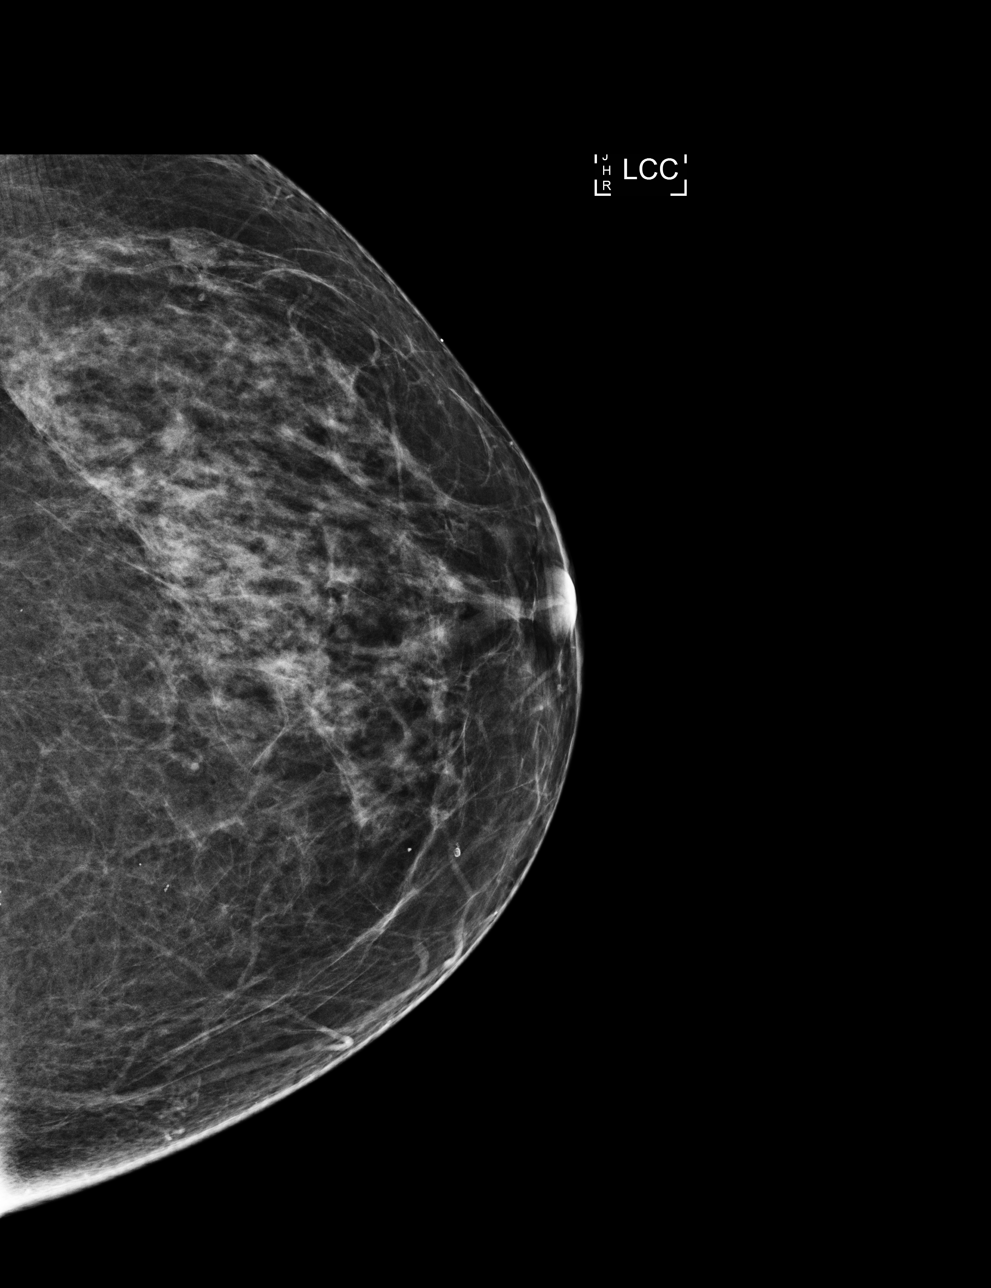

[4 of 4 positions shown; findings below may reference images not displayed]

ACR Breast Density Category c: The breast tissue is heterogeneously
dense, which may obscure small masses.
FINDINGS: In the right breast, a possible mass warrants further evaluation. In
the left breast, no findings suspicious for malignancy. Images were
processed with CAD.
IMPRESSION: Further evaluation is suggested for possible mass in the right
breast.

RECOMMENDATION:
Diagnostic mammogram and possibly ultrasound of the right breast.
(Code:[id number])

The patient will be contacted regarding the findings, and additional
imaging will be scheduled.

BI-RADS CATEGORY  0: Incomplete. Need additional imaging evaluation
and/or prior mammograms for comparison.

## 2018-11-15 ENCOUNTER — Other Ambulatory Visit: Payer: Self-pay | Admitting: Nurse Practitioner

## 2018-11-15 DIAGNOSIS — Z780 Asymptomatic menopausal state: Secondary | ICD-10-CM

## 2018-11-21 ENCOUNTER — Ambulatory Visit
Admission: RE | Admit: 2018-11-21 | Discharge: 2018-11-21 | Disposition: A | Discharge status: Home / Self Care | Payer: 59 | Source: Ambulatory Visit | Attending: Nurse Practitioner | Admitting: Nurse Practitioner

## 2018-11-21 DIAGNOSIS — Z780 Asymptomatic menopausal state: Secondary | ICD-10-CM

## 2019-02-22 ENCOUNTER — Encounter: Admit: 2019-02-22 | Discharge: 2019-02-23 | Payer: MEDICARE | Attending: Adult Health | Primary: Adult Health

## 2019-02-22 DIAGNOSIS — Z171 Estrogen receptor negative status [ER-]: Principal | ICD-10-CM

## 2019-02-22 DIAGNOSIS — C50411 Malignant neoplasm of upper-outer quadrant of right female breast: Principal | ICD-10-CM

## 2019-04-04 ENCOUNTER — Encounter: Payer: Self-pay | Admitting: Gastroenterology

## 2019-05-17 ENCOUNTER — Encounter: Admit: 2019-05-17 | Discharge: 2019-05-17 | Payer: MEDICARE

## 2019-05-17 DIAGNOSIS — R928 Other abnormal and inconclusive findings on diagnostic imaging of breast: Principal | ICD-10-CM

## 2019-06-16 ENCOUNTER — Encounter (HOSPITAL_COMMUNITY): Payer: Self-pay | Admitting: Emergency Medicine

## 2019-06-16 ENCOUNTER — Emergency Department (HOSPITAL_COMMUNITY)
Admission: EM | Admit: 2019-06-16 | Discharge: 2019-06-16 | Disposition: A | Discharge status: Home / Self Care | Payer: Managed Care, Other (non HMO) | Attending: Emergency Medicine | Admitting: Emergency Medicine

## 2019-06-16 DIAGNOSIS — R6884 Jaw pain: Secondary | ICD-10-CM | POA: Insufficient documentation

## 2019-06-16 DIAGNOSIS — Z5321 Procedure and treatment not carried out due to patient leaving prior to being seen by health care provider: Secondary | ICD-10-CM | POA: Diagnosis not present

## 2019-06-16 HISTORY — DX: Unspecified temporomandibular joint disorder, unspecified side: M26.609

## 2019-06-16 NOTE — ED Triage Notes (Signed)
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

## 2019-06-16 NOTE — ED Notes (Signed)
Pt gave her stickers to EMS and states, "I'm going to [place]."

## 2019-07-06 ENCOUNTER — Encounter
Admit: 2019-07-06 | Discharge: 2019-07-21 | Payer: MEDICARE | Attending: Radiation Oncology | Primary: Radiation Oncology

## 2019-07-06 DIAGNOSIS — C50411 Malignant neoplasm of upper-outer quadrant of right female breast: Principal | ICD-10-CM

## 2019-07-06 DIAGNOSIS — Z171 Estrogen receptor negative status [ER-]: Secondary | ICD-10-CM

## 2019-11-09 ENCOUNTER — Encounter: Admit: 2019-11-09 | Discharge: 2019-11-09 | Payer: MEDICARE | Attending: Adult Health | Primary: Adult Health

## 2019-11-09 ENCOUNTER — Ambulatory Visit: Admit: 2019-11-09 | Discharge: 2019-11-09 | Payer: MEDICARE

## 2019-11-09 DIAGNOSIS — C50411 Malignant neoplasm of upper-outer quadrant of right female breast: Principal | ICD-10-CM

## 2019-11-09 DIAGNOSIS — Z171 Estrogen receptor negative status [ER-]: Secondary | ICD-10-CM

## 2020-04-04 ENCOUNTER — Ambulatory Visit
Admit: 2020-04-04 | Discharge: 2020-04-19 | Payer: MEDICARE | Attending: Radiation Oncology | Primary: Radiation Oncology

## 2020-11-07 ENCOUNTER — Ambulatory Visit
Admit: 2020-11-07 | Discharge: 2020-11-19 | Payer: MEDICARE | Attending: Radiation Oncology | Primary: Radiation Oncology

## 2020-11-07 ENCOUNTER — Ambulatory Visit: Admit: 2020-11-07 | Discharge: 2020-11-07 | Payer: MEDICARE

## 2020-11-07 DIAGNOSIS — Z9221 Personal history of antineoplastic chemotherapy: Principal | ICD-10-CM

## 2020-11-07 DIAGNOSIS — Z171 Estrogen receptor negative status [ER-]: Principal | ICD-10-CM

## 2020-11-07 DIAGNOSIS — C50411 Malignant neoplasm of upper-outer quadrant of right female breast: Principal | ICD-10-CM

## 2020-11-07 DIAGNOSIS — I890 Lymphedema, not elsewhere classified: Principal | ICD-10-CM

## 2021-04-07 ENCOUNTER — Other Ambulatory Visit: Payer: Self-pay | Admitting: Nurse Practitioner

## 2021-04-07 DIAGNOSIS — E2839 Other primary ovarian failure: Secondary | ICD-10-CM

## 2021-04-14 MED ORDER — TRIAMCINOLONE ACETONIDE 0.1 % TOPICAL CREAM
Freq: Once | 0 days | PRN
Start: 2021-04-14 — End: ?

## 2021-05-07 ENCOUNTER — Ambulatory Visit: Admit: 2021-05-07 | Discharge: 2021-05-07 | Payer: MEDICARE

## 2021-05-07 DIAGNOSIS — Z171 Estrogen receptor negative status [ER-]: Principal | ICD-10-CM

## 2021-05-07 DIAGNOSIS — C50411 Malignant neoplasm of upper-outer quadrant of right female breast: Principal | ICD-10-CM

## 2021-09-18 ENCOUNTER — Ambulatory Visit
Admission: RE | Admit: 2021-09-18 | Discharge: 2021-09-18 | Disposition: A | Discharge status: Home / Self Care | Payer: BC Managed Care – PPO | Source: Ambulatory Visit | Attending: Nurse Practitioner | Admitting: Nurse Practitioner

## 2021-09-18 ENCOUNTER — Other Ambulatory Visit: Payer: Self-pay

## 2021-09-18 DIAGNOSIS — E2839 Other primary ovarian failure: Secondary | ICD-10-CM

## 2021-11-11 ENCOUNTER — Ambulatory Visit: Admit: 2021-11-11 | Discharge: 2021-11-11 | Payer: MEDICARE

## 2021-11-11 ENCOUNTER — Ambulatory Visit
Admit: 2021-11-11 | Discharge: 2021-11-12 | Payer: MEDICARE | Attending: Radiation Oncology | Primary: Radiation Oncology

## 2021-11-11 ENCOUNTER — Ambulatory Visit: Admit: 2021-11-11 | Payer: MEDICARE | Attending: Radiation Oncology | Primary: Radiation Oncology

## 2021-11-11 DIAGNOSIS — Z171 Estrogen receptor negative status [ER-]: Principal | ICD-10-CM

## 2021-11-11 DIAGNOSIS — C50411 Malignant neoplasm of upper-outer quadrant of right female breast: Principal | ICD-10-CM

## 2022-05-22 ENCOUNTER — Ambulatory Visit: Admit: 2022-05-22 | Discharge: 2022-05-23 | Payer: MEDICARE

## 2022-05-22 DIAGNOSIS — I890 Lymphedema, not elsewhere classified: Principal | ICD-10-CM

## 2022-05-22 DIAGNOSIS — C50411 Malignant neoplasm of upper-outer quadrant of right female breast: Principal | ICD-10-CM

## 2022-05-22 DIAGNOSIS — Z171 Estrogen receptor negative status [ER-]: Principal | ICD-10-CM

## 2022-09-02 ENCOUNTER — Encounter: Payer: Self-pay | Admitting: Rehabilitation

## 2022-09-02 ENCOUNTER — Ambulatory Visit: Payer: Medicare Other | Attending: Nurse Practitioner | Admitting: Rehabilitation

## 2022-09-02 ENCOUNTER — Other Ambulatory Visit: Payer: Self-pay

## 2022-09-02 DIAGNOSIS — I890 Lymphedema, not elsewhere classified: Secondary | ICD-10-CM

## 2022-09-02 NOTE — Therapy (Signed)
OUTPATIENT PHYSICAL THERAPY ONCOLOGY EVALUATION  Patient Name: Andrea Mccann MRN: [id number] DOB:12/02/1951, 71 y.o., female Today's Date: 09/02/2022   PT End of Session - 09/02/22 1150     Visit Number 1    Number of Visits 4    Date for PT Re-Evaluation 10/14/22    PT Start Time 1000    PT Stop Time 1100    PT Time Calculation (min) 60 min    Activity Tolerance Patient tolerated treatment well    Behavior During Therapy [facility] for tasks assessed/performed             Past Medical History:  Diagnosis Date   TMJ disease    History reviewed. No pertinent surgical history. Patient Active Problem List   Diagnosis Date Noted   IRRITABLE BOWEL SYNDROME 04/09/2009   UTI 04/09/2009   ABDOMINAL PAIN, GENERALIZED 04/09/2009   HYPERLIPIDEMIA 04/08/2009   HYPERTENSION 04/08/2009   GERD 04/08/2009   CHEST PAIN 04/08/2009    PCP: Elizabeth Palau, NP  REFERRING PROVIDER: [email or web address]  REFERRING DIAG: Rt UE lymphedema  THERAPY DIAG:  Lymphedema, not elsewhere classified  ONSET DATE: 2018  Rationale for Evaluation and Treatment Rehabilitation  SUBJECTIVE                                                                                                                                                                                           SUBJECTIVE STATEMENT: Is there anything I can do for the swelling? I have my day sleeve and my night sleeve.  I don't wear them much.  I think it is getting a bit worse.    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   PAIN:  Are you having pain? No  PRECAUTIONS: Rt UE lymphedema  WEIGHT BEARING RESTRICTIONS No  FALLS:  Has patient fallen in last 6 months? 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    LEISURE: walking per day   HAND DOMINANCE : right   PRIOR LEVEL OF FUNCTION: Independent  PATIENT GOALS see if we can get it down.     OBJECTIVE COGNITION:  Overall cognitive status: Within functional limits for tasks assessed   PALPATION: Pitting from wrist to elbow  OBSERVATIONS / OTHER ASSESSMENTS: Rt UE larger especially in the forearm- upper arm appears similar in size  POSTURE: WNL   UPPER EXTREMITY AROM/PROM:  A/PROM RIGHT  03/28/18  09/02/22  Shoulder extension    Shoulder flexion 145   Shoulder abduction 154   Shoulder internal rotation    Shoulder external rotation 84     (Blank rows = not tested)  A/PROM LEFT   eval 09/02/22  Shoulder extension    Shoulder flexion    Shoulder abduction    Shoulder internal rotation    Shoulder external rotation      (Blank rows = not tested)   LYMPHEDEMA ASSESSMENTS:  LANDMARK RIGHT  eval 09/02/22  Axilla  32.4  15cm 29.7 31  10  cm proximal to olecranon process 29.5 30.8  Olecranon process 28 29  15cm 27.7 30  10  cm proximal to ulnar styloid process 25 27  Just proximal to ulnar styloid process 17.7 19  Across hand at thumb web space 20.7 20.7  At base of 2nd digit 6.9 6.8  (Blank rows = not tested) using 10 as length  LANDMARK LEFT  eval     15cm 31  10 cm proximal to olecranon process 29.6  Olecranon process 28  15cm 26  10 cm proximal to ulnar styloid process 22.5  Just proximal to ulnar styloid process 17.5  Across hand at thumb web space 20.7  At base of 2nd digit 6.8  (Blank rows = not tested)   TODAY'S TREATMENT  Date: 09/02/22 Discussed options: bandaging, bandaging at night, using velcro as much as possible with new night and day garments.   Measured pt for new circaid profile night garment size 4 long and juzo dynamic size 4 long and gauntlet size L for self order  PATIENT EDUCATION:  Education details: POC  options Person educated: Patient Education method: Explanation, Demonstration, Tactile cues, Verbal cues, and Handouts Education comprehension: verbalized understanding  ASSESSMENT: CLINICAL IMPRESSION: Patient is a 71 y.o. female who was seen today for physical therapy evaluation and treatment for her chronic lymphedema in the Rt UE.  Pt was seen here 3 years ago for the same and has never done a great job with wearing her day compression and has the same juzo soft sleeve from this time which she does wear with exercise and work.  The lymphedema is mainly in the forearm and has increased 2-3cm since last sessions here.  Volume different is .  Pitting is present from elbow to wrist.  Pt does not want to wrap at this time but was educated on how wrapping is the gold standard.  She does already have a circaid juxtalite at home and she will currently: use her current velcro day and night sleeve as much as possible and order new garments per above.  We will check MLD performance on next visit and fit of any new sleeves.  Will send demo to [employer].    OBJECTIVE IMPAIRMENTS decreased knowledge of condition, decreased knowledge of use of DME, and increased edema.   ACTIVITY LIMITATIONS none but arm is heavy  PARTICIPATION LIMITATIONS: none  PERSONAL FACTORS ALND, chronic status are also affecting patient's functional outcome.   REHAB POTENTIAL: Good  CLINICAL DECISION MAKING: Stable/uncomplicated  EVALUATION COMPLEXITY: Low  GOALS: Goals reviewed with patient? Yes   LONG TERM GOALS: Target date: 10/14/2022    Pt will obtain new compression garments for lymphedema Baseline:  Goal status: INITIAL  2.  Pt will be ind with self MLD or use of pump Baseline:  Goal status: INITIAL   PLAN: PT FREQUENCY: 2x/month  PT DURATION: 6 weeks  PLANNED INTERVENTIONS: Therapeutic exercises, Patient/Family  education, Self Care, Orthotic/Fit training, DME instructions, Manual therapy, and  Re-evaluation  PLAN FOR NEXT SESSION: any new garments? Review self MLD   Idamae Lusher, PT 09/02/2022, 11:51 AM

## 2022-09-16 ENCOUNTER — Ambulatory Visit: Payer: Medicare Other | Admitting: Rehabilitation

## 2022-09-16 DIAGNOSIS — I890 Lymphedema, not elsewhere classified: Secondary | ICD-10-CM | POA: Diagnosis not present

## 2022-09-16 NOTE — Patient Instructions (Signed)

## 2022-09-16 NOTE — Therapy (Addendum)
OUTPATIENT PHYSICAL THERAPY ONCOLOGY TREATMENT  Patient Name: Andrea Mccann MRN: [id number] DOB:[birth year], 71 y.o., female Today's Date: 09/17/2022   PT End of Session - 09/17/22 1119     Visit Number 2    Number of Visits 4    PT Start Time 1200    PT Stop Time 1254    PT Time Calculation (min) 54 min    Activity Tolerance Patient tolerated treatment well    Behavior During Therapy WFL for tasks assessed/performed              Past Medical History:  Diagnosis Date   TMJ disease    History reviewed. No pertinent surgical history. Patient Active Problem List   Diagnosis Date Noted   IRRITABLE BOWEL SYNDROME 04/09/2009   UTI 04/09/2009   ABDOMINAL PAIN, GENERALIZED 04/09/2009   HYPERLIPIDEMIA 04/08/2009   HYPERTENSION 04/08/2009   GERD 04/08/2009   CHEST PAIN 04/08/2009    PCP: Vicenta Aly, NP  REFERRING PROVIDER: [email or web address] [email or web address]  REFERRING DIAG: Rt UE lymphedema  THERAPY DIAG:  Lymphedema, not elsewhere classified  ONSET DATE: 2018  Rationale for Evaluation and Treatment Rehabilitation  SUBJECTIVE                                                                                                                                                                                           SUBJECTIVE STATEMENT: I got all the new stuff.  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   PAIN:  Are you having pain? No  PRECAUTIONS: Rt UE lymphedema  WEIGHT BEARING RESTRICTIONS No  FALLS:  Has patient fallen in last 6 months? 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    LEISURE: walking 37min per day   HAND DOMINANCE :  right   PRIOR LEVEL OF FUNCTION: Independent  PATIENT GOALS see if we can get it down.     OBJECTIVE COGNITION:  Overall cognitive status: Within functional limits for tasks assessed   PALPATION: Pitting from wrist to elbow  OBSERVATIONS / OTHER ASSESSMENTS: Rt UE larger especially in the forearm- upper arm appears similar in size  POSTURE: WNL   UPPER EXTREMITY AROM/PROM:  A/PROM RIGHT   03/28/18  09/02/22  Shoulder extension    Shoulder flexion 145   Shoulder abduction 154   Shoulder internal  rotation    Shoulder external rotation 84     (Blank rows = not tested)  A/PROM LEFT   eval 09/02/22  Shoulder extension    Shoulder flexion    Shoulder abduction    Shoulder internal rotation    Shoulder external rotation      (Blank rows = not tested)   LYMPHEDEMA ASSESSMENTS:  LANDMARK RIGHT  eval 09/02/22 09/16/22  Axilla  32.4 32.2  15cm 29.7 31 30.3  10 cm proximal to olecranon process 29.5 30.8 30.7  Olecranon process _0 15cm 27.7 30 30.8  10 cm proximal to ulnar styloid process 25 27 17.5  Just proximal to ulnar styloid process 17.7 19 18.4  Across hand at thumb web space 20.7 20.7 20.8  At base of 2nd digit 6.9 6.8 6.8  (Blank rows = not tested) 2568m using 10 as length 23379mon 08/27/22 Chest circumference under axilla 96.5  LANDMARK LEFT  eval     15cm 31  10 cm proximal to olecranon process 29.6  Olecranon process 28  15cm 26  10 cm proximal to ulnar styloid process 22.5  Just proximal to ulnar styloid process 17.5  Across hand at thumb web space 20.7  At base of 2nd digit 6.8  (Blank rows = not tested) 216041m TODAY'S TREATMENT  Date: 09/16/22 Remeasured pt which demonstrates almost a 200m30mfference with use of new garments Discussed garment use and when to get new.   Discussed flexitouch options and pt is still interested in this - [facility] of self MLD per instruction section with review of all steps and pt  performance.  Pt needed cueing to review skin stretch vs slide but overall had good knowledge of this from home.     Date: 09/02/22 Discussed options: bandaging, bandaging at night, using velcro as much as possible with new night and day garments.   Measured pt for new circaid profile night garment size 4 long and juzo dynamic size 4 long and gauntlet size L for self order  PATIENT EDUCATION:  Education details: POC options Person educated: Patient Education method: Explanation, Demonstration, Tactile cues, Verbal cues, and Handouts Education comprehension: verbalized understanding  ASSESSMENT: CLINICAL IMPRESSION: Pt has been able to decrease volume by at least 200ml46mh use of new garments.  She reports she is more faithful with wearing them now as well.  She does continue with chronic lymphedema in the Rt LE and chronic trunk and lateral right trunk edema since surgery.  She will check at the 4 week mark to assess for flexitouch.      OBJECTIVE IMPAIRMENTS decreased knowledge of condition, decreased knowledge of use of DME, and increased edema.   ACTIVITY LIMITATIONS none but arm is heavy  PARTICIPATION LIMITATIONS: none  PERSONAL FACTORS ALND, chronic status are also affecting patient's functional outcome.   REHAB POTENTIAL: Good  CLINICAL DECISION MAKING: Stable/uncomplicated  EVALUATION COMPLEXITY: Low  GOALS: Goals reviewed with patient? Yes   LONG TERM GOALS: Target date: 10/29/2022    Pt will obtain new compression garments for lymphedema Baseline:  Goal status: MET  2.  Pt will be ind with self MLD or use of pump Baseline:  Goal status: MET   PLAN: PT FREQUENCY: 2x/month  PT DURATION: 6 weeks  PLANNED INTERVENTIONS: Therapeutic exercises, Patient/Family education, Self Care, Orthotic/Fit training, DME instructions, Manual therapy, and Re-evaluation  PLAN FOR NEXT SESSION: recheck for flexitouch visit    Dameisha Tschida, KaraMarcene Brawn  R, PT 09/17/2022, 12:16  PM  PHYSICAL THERAPY DISCHARGE SUMMARY  Visits from Start of Care: 2  Current functional level related to goals / functional outcomes: See above   Remaining deficits: Chronic lymphedema   Education / Equipment: Per above  Plan: Patient agrees to discharge.  Patient is being discharged due to meeting the stated rehab goals.

## 2022-09-17 ENCOUNTER — Encounter: Payer: Self-pay | Admitting: Rehabilitation

## 2022-09-30 ENCOUNTER — Encounter: Payer: PRIVATE HEALTH INSURANCE | Admitting: Rehabilitation

## 2022-11-23 ENCOUNTER — Ambulatory Visit
Admit: 2022-11-23 | Discharge: 2022-11-24 | Payer: MEDICARE | Attending: Radiation Oncology | Primary: Radiation Oncology

## 2022-11-23 ENCOUNTER — Ambulatory Visit: Admit: 2022-11-23 | Payer: MEDICARE | Attending: Radiation Oncology | Primary: Radiation Oncology

## 2022-11-23 ENCOUNTER — Ambulatory Visit: Admit: 2022-11-23 | Discharge: 2022-11-24 | Payer: MEDICARE

## 2022-11-24 DIAGNOSIS — Z1231 Encounter for screening mammogram for malignant neoplasm of breast: Principal | ICD-10-CM

## 2022-11-24 DIAGNOSIS — C50411 Malignant neoplasm of upper-outer quadrant of right female breast: Principal | ICD-10-CM

## 2022-11-24 DIAGNOSIS — Z171 Estrogen receptor negative status [ER-]: Principal | ICD-10-CM

## 2023-06-04 ENCOUNTER — Ambulatory Visit: Admit: 2023-06-04 | Discharge: 2023-06-05 | Payer: MEDICARE | Attending: Surgical | Primary: Surgical

## 2023-06-21 ENCOUNTER — Other Ambulatory Visit: Payer: Self-pay

## 2023-06-21 ENCOUNTER — Ambulatory Visit: Payer: Medicare HMO | Attending: Surgery | Admitting: Rehabilitation

## 2023-06-21 ENCOUNTER — Encounter: Payer: Self-pay | Admitting: Rehabilitation

## 2023-06-21 DIAGNOSIS — I972 Postmastectomy lymphedema syndrome: Secondary | ICD-10-CM | POA: Insufficient documentation

## 2023-06-21 DIAGNOSIS — I890 Lymphedema, not elsewhere classified: Secondary | ICD-10-CM | POA: Diagnosis present

## 2023-06-21 NOTE — Therapy (Addendum)
OUTPATIENT PHYSICAL THERAPY ONCOLOGY EVALUATION  Patient Name: Andrea Mccann MRN: [id number] DOB:1951-05-20, 72 y.o., female Today's Date: 06/22/2023   PT End of Session - 06/22/23 1120     Visit Number 1    Number of Visits 4    Date for PT Re-Evaluation 09/14/23    PT Start Time 1500    PT Stop Time 1553    PT Time Calculation (min) 53 min    Activity Tolerance Patient tolerated treatment well    Behavior During Therapy [facility] for tasks assessed/performed              Past Medical History:  Diagnosis Date   TMJ disease    History reviewed. No pertinent surgical history. Patient Active Problem List   Diagnosis Date Noted   IRRITABLE BOWEL SYNDROME 04/09/2009   UTI 04/09/2009   ABDOMINAL PAIN, GENERALIZED 04/09/2009   HYPERLIPIDEMIA 04/08/2009   HYPERTENSION 04/08/2009   GERD 04/08/2009   CHEST PAIN 04/08/2009    PCP: Elizabeth Palau, NP  REFERRING PROVIDER: Mercy Riding, PA-C  REFERRING DIAG: Rt post mastectomy lymphedema of the arm  THERAPY DIAG:  Lymphedema, not elsewhere classified  Post-mastectomy lymphedema syndrome  ONSET DATE: 2018  Rationale for Evaluation and Treatment Rehabilitation  SUBJECTIVE                                                                                                                                                                                           SUBJECTIVE STATEMENT: Last time they called me about getting the pump and then I called them back and nobody ever called me back again.  I want to get the pump trial now to see.  I am wearing my night garment every night and doing my self massage.  I do notice that the swelling  is worse in the heat.  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   PAIN:  Are  you having pain? No  PRECAUTIONS: Rt UE lymphedema  WEIGHT BEARING RESTRICTIONS No  FALLS:  Has patient fallen in last 6 months? 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    LEISURE: walking per day   HAND DOMINANCE : right   PRIOR LEVEL OF FUNCTION: Independent  PATIENT GOALS see if we can get it down.  OBJECTIVE COGNITION:  Overall cognitive status: Within functional limits for tasks assessed   PALPATION: Pitting from wrist to elbow  OBSERVATIONS / OTHER ASSESSMENTS: Rt UE larger especially in the forearm- upper arm appears similar in size, Juzo 20-1mmHG size IV L is what she is currently wearing. Increased edema Rt axilla and lateral trunk at bra line which worsens in the heat per pt.   POSTURE: WNL   UPPER EXTREMITY AROM/PROM:  A/PROM RIGHT   06/21/23    Shoulder extension    Shoulder flexion 145   Shoulder abduction 154   Shoulder internal rotation    Shoulder external rotation 84     (Blank rows = not tested)   LYMPHEDEMA ASSESSMENTS:  LANDMARK RIGHT  eval 09/02/22 06/21/23  Axilla  32.4 34.5  15cm 29.7 31   10  cm proximal to olecranon process 29.5 30.8 F=30.1  Olecranon process 28 29 31   15cm 27.7 30   10  cm proximal to ulnar styloid process 25 27 D=28.5  Just proximal to ulnar styloid process 17.7 19 18.3  Across hand at thumb web space 20.7 20.7 21  At base of 2nd digit 6.9 6.8   (Blank rows = not tested) using 10 as length  LANDMARK LEFT  eval     15cm 31  10 cm proximal to olecranon process 29.6  Olecranon process 28  15cm 26  10 cm proximal to ulnar styloid process 22.5  Just proximal to ulnar styloid process 17.5  Across hand at thumb web space 20.7  At base of 2nd digit 6.8  (Blank rows = not tested)   TODAY'S TREATMENT  06/22/23 Re-assessed status and compression Measured for  [employer] Custom fit night garment as pt is having increasing shoulder pain with the positioning of her current tribute night garment due to size and inability to bend her elbow.  She will benefit from a thinner custom night garment to decrease arm pain. She was measured for a arm plus hand garment due to hand edema in 15-30mmHg.  She is also going to try a flat knit garment know that her insurance will cover it.  She was measured for a flat knit class 2 medi mondi 250 CG garment which will benefit pt from her current circular knit garment to decrease binding at the elbow and to decrease filling while wearing her circular knit which is currently happening.  She will do without a hand piece for now as she is unable to wear these at work and can use her current pieces.   We will also send information to [employer] for a pump trial  Date: 09/02/22 Discussed options: bandaging, bandaging at night, using velcro as much as possible with new night and day garments.   Measured pt for new circaid profile night garment size 4 long and juzo dynamic size 4 long and gauntlet size L for self order  PATIENT EDUCATION:  Education details: POC options Person educated: Patient Education method: Explanation, Demonstration, Tactile cues, Verbal cues, and Handouts Education comprehension: verbalized understanding  ASSESSMENT: CLINICAL IMPRESSION: Patient is a 72 y.o. female who was seen today for physical therapy evaluation and treatment for her chronic lymphedema in the Rt UE.  Pt was seen here 3 years ago and last year for the same and has been using her daytime Juzo dynamic sleeve, her night tribute sleeve, and doing self MLD x 4 years.  She is recently noticing that she is swelling into her day sleeve in the heat of  the summer.  The lymphedema is mainly in the forearm and has increased 2-3cm since last sessions here.  Volume different is .  Pitting is present from elbow to wrist.   She is a stage 2-3 lymphedema  as she does get some reduction overnight but has no full reduction and continues with fibrosis in the forearm. See custom order information above in today's note.  Will send demo to [employer].    OBJECTIVE IMPAIRMENTS decreased knowledge of condition, decreased knowledge of use of DME, and increased edema.   ACTIVITY LIMITATIONS none but arm is heavy  PARTICIPATION LIMITATIONS: none  PERSONAL FACTORS ALND, chronic status are also affecting patient's functional outcome.   REHAB POTENTIAL: Good  CLINICAL DECISION MAKING: Stable/uncomplicated  EVALUATION COMPLEXITY: Low  GOALS: Goals reviewed with patient? Yes   LONG TERM GOALS: Target date: 09/14/23  Pt will obtain new compression garments for lymphedema Baseline:  Goal status: INITIAL  2.  Pt will be ind with self MLD or use of pump Baseline:  Goal status: Partialy met - is ind with MLD   PLAN: PT FREQUENCY: 2x/month  PT DURATION: 12 weeks   PLANNED INTERVENTIONS: Therapeutic exercises, Patient/Family education, Self Care, Orthotic/Fit training, DME instructions, Manual therapy, and Re-evaluation  PLAN FOR NEXT SESSION: pump and garment visits as needed Update 07/06/23 - second request for prescription and called pt to see if she can also request 07/16/23 - prescription received and order sent to [facility] custom - [facility] not sure if they are in network and will let us know 08/09/23 -  [facility] [employer] reports waiting for Medicare CMN   Idamae Lusher, PT 06/22/2023, 11:21 AM   DIAGNOSIS ASSESSMENT []  Lymphedema, not elsewhere classified [I89.0]  Secondary to:  [x]  Post-mastectomy lymphedema [I97.2]   []  Hereditary/Congenital lymphedema [Q82.0]  []  Praecox []  Tarda []  Other:   LOCATION OF LYMPHEDEMA [x]   Right Arm  []  Left Arm  []  Chest/Breast  []  Abdomen/Trunk []  Other: Date of onset, or duration:   SEVERITY Symptoms and observations from physical exam Stage of Lymphedema:  [] Stage II  [x]  Stage III  [] Other: []   Hyperkeratosis []  Hyperpigmentation []  Lymphorrhea (weeping) []  Papillomatosis (warts, nodules, papules)  []  Elephantiasis  [x]  Fibrosis and/or radiation fibrosis [x]  Pitting edema []  Recurrent episode(s) of infection/cellulitis []  Pain []  Peau d'orange [x]  Chest and/or axillary swelling caused by: []  History of chest/breast/abdominal surgery(ies) [x]  Lymph node dissection, radiation and or chemotherapy []  Truncal and/or abdominal swelling []  Axillary cording []  Other, explain:  CONSERVATIVE TREATMENT (CT) INITATION Start date (if different from this visit date): 03/06/2018 to current most recent eval 06/21/23  Conservative treatment plan includes the following activities (check all that apply): [x]  Regular exercise  [x]  Elevation of the limb(s) [x]  Compression garment(s) or compression bandage system  [x]  Compression garments with a minimum of     30        mmHg distally  [x]  Compression bra and/or tank top []  Compression bandage system  Type: []  Multi-layer short stretch [x]  Velcro reduction kit    []  Two- or three-layer prepackaged compression kit   []  Other:  []  Complete decongestive therapy (CDT) or lymphedema therapy   [x]  Minimum of 30 minutes daily MLD or self-MLD  [x]  Diet assessment with modification, if applicable. []  For patients with congestive heart failure (CHF) a medication assessment with modification(s), if applicable. []  Other:   Has the patient tried an [id number] basic pneumatic compression device? []  Yes  [x]  No Comments:   MEASUREMENTS  Choose at least two anatomical locations. See above for arm measurements Axilla included below  [x]   Right Arm    []   Left Arm    Wrist                     cm  Wrist                     cm  Forearm                     cm  Forearm                     cm  Biceps                     cm  Biceps                     cm  Axilla (Chest)*       104.5              cm  Axilla (Chest)*                     cm  Umbilicus (Waist)*                      cm  Umbilicus (Waist)*                     cm  *Include measurements if patient presents with lymphedema proximally into the chest or trunk    Other history and clinical information:

## 2023-09-06 ENCOUNTER — Encounter: Payer: Self-pay | Admitting: Rehabilitation

## 2023-09-06 ENCOUNTER — Ambulatory Visit: Payer: Medicare HMO | Attending: Surgery | Admitting: Rehabilitation

## 2023-09-06 DIAGNOSIS — I972 Postmastectomy lymphedema syndrome: Secondary | ICD-10-CM | POA: Insufficient documentation

## 2023-09-06 DIAGNOSIS — I890 Lymphedema, not elsewhere classified: Secondary | ICD-10-CM | POA: Insufficient documentation

## 2023-09-06 NOTE — Therapy (Addendum)
 OUTPATIENT PHYSICAL THERAPY ONCOLOGY TREATMENT  Patient Name: Andrea Mccann MRN: [id number] DOB:12/05/1951, 72 y.o., female Today's Date: 09/06/2023   PT End of Session - 09/06/23 1156     Visit Number 2    Number of Visits 4    PT Start Time 1200    PT Stop Time 1230    PT Time Calculation (min) 30 min    Activity Tolerance Patient tolerated treatment well    Behavior During Therapy WFL for tasks assessed/performed              Past Medical History:  Diagnosis Date   TMJ disease    History reviewed. No pertinent surgical history. 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  I have not heard from [facility] custom except for a text message stating I would get updates.   EVAL: Last time they called me about getting the pump and then I called them back and nobody ever called me back again.  I want to get the pump trial now to see.  I am wearing my night garment every night and doing my self massage.  I do notice that the swelling  is worse in the heat.  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   PAIN:  Are you having pain? No  PRECAUTIONS: Rt UE lymphedema  WEIGHT BEARING RESTRICTIONS No  FALLS:  Has patient fallen in last 6 months? 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    LEISURE: walking per day   HAND DOMINANCE : right   PRIOR LEVEL OF FUNCTION: Independent  PATIENT GOALS see if we can get it down.     OBJECTIVE COGNITION:  Overall cognitive status: Within functional limits for tasks assessed   PALPATION: Pitting from wrist to elbow  OBSERVATIONS / OTHER ASSESSMENTS: Rt UE larger especially in the forearm- upper arm appears similar in size, Juzo 20-69mmHG size IV L is what she is currently wearing. Increased edema Rt axilla and lateral trunk at bra line which worsens in the heat per pt.   POSTURE: WNL   UPPER EXTREMITY AROM/PROM:  A/PROM RIGHT   06/21/23  09/06/23  Shoulder extension    Shoulder flexion 145 150  Shoulder abduction 154 155  Shoulder internal rotation    Shoulder external rotation 84     (Blank rows = not tested)   LYMPHEDEMA ASSESSMENTS:  LANDMARK RIGHT  eval 09/02/22 06/21/23 09/06/23  Axilla  32.4 34.5 34  15cm 29.7 31  31  10  cm proximal to olecranon process 29.5 30.8 F=30.1 30  Olecranon process 28 29 31 30   15cm 27.7 30  29.6  10 cm proximal to ulnar styloid process 25 27 D=28.5 26.5  Just proximal to ulnar styloid process 17.7 19 18.3 17.8  Across hand at thumb web space 20.7 20.7 21 21   At base of 2nd digit 6.9 6.8  7.0  (Blank rows = not tested) using 10 as length  LANDMARK LEFT  eval     15cm 31  10 cm proximal to olecranon process 29.6  Olecranon process 28  15cm 26  10 cm proximal to ulnar styloid process 22.5  Just  proximal to ulnar styloid process 17.5  Across hand at thumb web space 20.7  At base of 2nd digit 6.8  (Blank rows = not tested)   TODAY'S TREATMENT  09/06/23 Remeasured UE after 10 weeks of conservative therapy.   See measurements below Discussed pump benefits and MLD vs pump Discussed new Rt hip pain and encouraged pt to see MD for imaging due to osteoporosis and cancer hx with new bone pain.  Then encouraged PT after for pain relief.    06/22/23 Re-assessed status and compression Measured for [employer] Custom fit night garment as pt is having increasing shoulder pain with the positioning of her current tribute night garment due to size and inability to bend her elbow.  She will benefit from a thinner custom night garment to decrease arm pain. She was measured for a arm plus hand garment due to hand edema in 15-30mmHg.  She is also going to try a flat knit garment know that her insurance will cover it.  She was measured for a flat knit class 2 medi mondi 250 CG garment which will benefit pt from her current circular knit garment to decrease binding at the elbow and to decrease filling while wearing her circular knit which is currently happening.  She will do without a hand piece for now as she is unable to wear these at work and can use her current pieces.   We will also send information to [employer] for a pump trial  Date: 09/02/22 Discussed options: bandaging, bandaging at night, using velcro as much as possible with new night and day garments.   Measured pt for new circaid profile night garment size 4 long and juzo dynamic size 4 long and gauntlet size L for self  order  PATIENT EDUCATION:  Education details: POC options Person educated: Patient Education method: Explanation, Demonstration, Tactile cues, Verbal cues, and Handouts Education comprehension: verbalized understanding  ASSESSMENT: CLINICAL IMPRESSION: Patient is a 72 y.o. female who was seen today for physical  therapy evaluation and treatment for her chronic lymphedema in the Rt UE.  Her measurements remain mostly consistent after 10 weeks of conservative treatment with compression , exercise, elevation, and MLD.  She continues with chest region edema that worsens with use of basic pump as indicated by her basic pump trial below.  She is still waiting for her custom compression.    OBJECTIVE IMPAIRMENTS decreased knowledge of condition, decreased knowledge of use of DME, and increased edema.   ACTIVITY LIMITATIONS none but arm is heavy  PARTICIPATION LIMITATIONS: none  PERSONAL FACTORS ALND, chronic status are also affecting patient's functional outcome.   REHAB POTENTIAL: Good  CLINICAL DECISION MAKING: Stable/uncomplicated  EVALUATION COMPLEXITY: Low  GOALS: Goals reviewed with patient? Yes   LONG TERM GOALS: Target date: 09/14/23  Pt will obtain new compression garments for lymphedema Baseline:  Goal status: MET  2.  Pt will be ind with self MLD or use of pump Baseline:  Goal status: MET   PLAN: PT FREQUENCY: 2x/month  PT DURATION: 12 weeks   PLANNED INTERVENTIONS: Therapeutic exercises, Patient/Family education, Self Care, Orthotic/Fit training, DME instructions, Manual therapy, and Re-evaluation  PLAN FOR NEXT SESSION: pump and garment visits as needed Update 07/06/23 - second request for prescription and called pt to see if she can also request 07/16/23 - prescription received and order sent to [facility] - [facility] not sure if they are in network and will let us know 08/09/23 -  [facility] reports waiting for Medicare CMN 09/06/23 - emailed [facility] for update and copied patient   Idamae Lusher, PT 09/06/2023, 12:39 PM   DIAGNOSIS ASSESSMENT []  Lymphedema, not elsewhere classified [I89.0]  Secondary to:  [x]  Post-mastectomy lymphedema [I97.2]   []  Hereditary/Congenital lymphedema [Q82.0]  []  Praecox []  Tarda []  Other:   LOCATION OF LYMPHEDEMA [x]   Right Arm  []   Left Arm  [x]  Chest/Breast  []  Abdomen/Trunk []  Other: Date of onset, or duration: 2018  SEVERITY Symptoms and observations from physical exam Stage of Lymphedema:  [] Stage II  [x]  Stage III  [] Other: []  Hyperkeratosis []  Hyperpigmentation []  Lymphorrhea (weeping) []  Papillomatosis (warts, nodules, papules)  []  Elephantiasis  [x]  Fibrosis and/or radiation fibrosis [x]  Pitting edema []  Recurrent episode(s) of infection/cellulitis []  Pain []  Peau d'orange [x]  Chest and/or axillary swelling caused by: []  History of chest/breast/abdominal surgery(ies) [x]  Lymph node dissection, radiation and or chemotherapy []  Truncal and/or abdominal swelling []  Axillary cording []  Other, explain:  CONSERVATIVE TREATMENT (CT) INITATION Start date (if different from this visit date): 03/06/2018 to current most recent eval 06/21/23 and now 09/06/23  Conservative treatment plan includes the following activities (check all that apply): [x]  Regular exercise  [x]  Elevation of the limb(s) [x]  Compression garment(s) or compression bandage system  [x]  Compression garments with a minimum of     30        mmHg distally  [x]  Compression bra and/or tank top []  Compression bandage system  Type: []  Multi-layer short stretch [x]  Velcro reduction kit    []  Two- or three-layer prepackaged compression kit   []  Other:  []  Complete decongestive therapy (CDT) or lymphedema therapy   [x]  Minimum of 30 minutes daily MLD or self-MLD  [x]  Diet assessment with modification, if  applicable. []  For patients with congestive heart failure (CHF) a medication assessment with modification(s), if applicable. []  Other:   Has the patient tried an E4540 basic pneumatic compression device? [x]  Yes  []  No Comments: One time trial and reason of failure of baisc pump - patient demo was on 08/05/23 in home. Patient had chest increase measurements from 89 cm to 91 cm after 10 minutes.   MEASUREMENTS Choose at least two anatomical locations.  See above for arm measurements Axilla included below  [x]   Right Arm  See above  []   Left Arm    Wrist                     cm  Wrist                     cm  Forearm                     cm  Forearm                     cm  Biceps                     cm  Biceps                     cm  Axilla (Chest)*       89cm  Axilla (Chest)*                     cm  Umbilicus (Waist)*                     cm  Umbilicus (Waist)*                     cm  *Include measurements if patient presents with lymphedema proximally into the chest or trunk    Other history and clinical information:  PHYSICAL THERAPY DISCHARGE SUMMARY  Visits from Start of Care: 2  Current functional level related to goals / functional outcomes: See above   Remaining deficits: Chronic lymphedema    Education / Equipment: Final plan  Plan: Patient agrees to discharge.  Patient is being discharged due to meeting the stated rehab goals.

## 2023-12-03 ENCOUNTER — Ambulatory Visit: Admit: 2023-12-03 | Attending: Radiation Oncology | Primary: Radiation Oncology

## 2023-12-03 ENCOUNTER — Ambulatory Visit: Admit: 2023-12-03 | Payer: MEDICARE

## 2023-12-03 ENCOUNTER — Ambulatory Visit: Admit: 2023-12-03 | Discharge: 2023-12-03

## 2023-12-03 DIAGNOSIS — Z171 Estrogen receptor negative status [ER-]: Principal | ICD-10-CM

## 2023-12-03 DIAGNOSIS — Z1231 Encounter for screening mammogram for malignant neoplasm of breast: Principal | ICD-10-CM

## 2023-12-03 DIAGNOSIS — C50411 Malignant neoplasm of upper-outer quadrant of right female breast: Principal | ICD-10-CM

## 2023-12-29 LAB — EXTERNAL GENERIC LAB PROCEDURE: COLOGUARD: NEGATIVE

## 2023-12-29 LAB — COLOGUARD: COLOGUARD: NEGATIVE

## 2024-04-11 NOTE — Therapy (Signed)
 OUTPATIENT PHYSICAL THERAPY THORACOLUMBAR EVALUATION   Patient Name: Andrea Mccann MRN: [id number] DOB:1951-07-05, 73 y.o., female Today's Date: 04/12/2024  END OF SESSION:  PT End of Session - 04/12/24 1616     Visit Number 1    Date for PT Re-Evaluation 06/07/24    Authorization Type [employer]    Progress Note Due on Visit 10    PT Start Time 1532    PT Stop Time 1618    PT Time Calculation (min) 46 min    Activity Tolerance Patient tolerated treatment well    Behavior During Therapy WFL for tasks assessed/performed             Past Medical History:  Diagnosis Date   TMJ disease    History reviewed. No pertinent surgical history. Patient Active Problem List   Diagnosis Date Noted   IRRITABLE BOWEL SYNDROME 04/09/2009   UTI 04/09/2009   ABDOMINAL PAIN, GENERALIZED 04/09/2009   HYPERLIPIDEMIA 04/08/2009   Essential hypertension 04/08/2009   GERD 04/08/2009   CHEST PAIN 04/08/2009    PCP: Yvonnie Heritage, FNP   REFERRING PROVIDER: Glennon Lao   REFERRING DIAG:  Diagnosis  M54.16 (ICD-10-CM) - Radiculopathy, lumbar region    Rationale for Evaluation and Treatment: Rehabilitation  THERAPY DIAG:  Other low back pain - Plan: PT plan of care cert/re-cert  Pain in right leg - Plan: PT plan of care cert/re-cert  Cramp and spasm - Plan: PT plan of care cert/re-cert  Muscle weakness (generalized) - Plan: PT plan of care cert/re-cert  ONSET DATE: off and on/chronic- ~6 months ago  SUBJECTIVE:                                                                                                                                                                                           SUBJECTIVE STATEMENT: Pt presents to PT with 6 month history of LBP and Rt LE pain.  Pt walks regularly and reports that she doesn't have increased pain with this. Pt had exercises from MD and reports she has stopped doing these as she was not sure if they were helping. MRI  showed mild central stenosis L3-5.  From MD visit on 03/28/24:  73 y.o. 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 These symptoms are worsened with prolonged walking consistent with neurogenic claudication. She has found relief with muscle relaxants and NSAIDs, as well as heat. I recommended the addition of a dedicated course of PT to which she is agreeable. We also discussed the role  of interventional procedures such as an L4/5 TFESI. For now she wishes to hold off on injection therapy, hopeful symptoms will continue to improve with the addition of PT.  PERTINENT HISTORY:  Rt mastectomy, lymphedema  PAIN: 04/12/24 Are you having pain? Yes: NPRS scale: 5/10 Pain location: low back and Rt LE Pain description: achy Aggravating factors:sitting on a hard surface, morning hours, sleep at night, going up steps Relieving factors: heat, shower   PRECAUTIONS: None  RED FLAGS: None   WEIGHT BEARING RESTRICTIONS: No  FALLS:  Has patient fallen in last 6 months? 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  DIAGNOSTIC FINDINGS:  Lumbar MRI  IMPRESSION:  1. Multilevel facet arthropathy and disc bulging most significant at L4-5 causing moderate central canal stenosis.  2. Additional mild central canal stenosis is present at the levels of L3-4 and L5-S1.   PATIENT SURVEYS:  04/12/24: Modified Oswestry 11/50=55% disability    COGNITION: Overall cognitive status: Within  functional limits for tasks assessed     SENSATION: WFL  MUSCLE LENGTH: Hamstrings limited by 50% bil with pain on Rt  POSTURE: rounded shoulders, forward head, and flexed trunk   PALPATION: Tension in bil lumbar paraspinals and bil gluteals, no pain reported with palpation   LUMBAR ROM:   Full lumbar A/ROM without increased pain.  No directional preference for flexion/extension with repetition  LOWER EXTREMITY ROM:     Bil hamstrings and IR limited by 25-50% bilaterally   LOWER EXTREMITY MMT:    MMT Right eval Left eval  Hip flexion 4- 4+  Hip extension 4+ 4+  Hip abduction 4+ 4+  Hip adduction    Hip internal rotation    Hip external rotation    Knee flexion 5 5  Knee extension 5 5  Ankle dorsiflexion    Ankle plantarflexion    Ankle inversion    Ankle eversion     (Blank rows = not tested)  LUMBAR SPECIAL TESTS:  Slump test: Negative   GAIT: Distance walked: 50 Assistive device utilized: None Level of assistance: Complete Independence Comments: WNL  TREATMENT DATE:  04/12/24 Findings from evaluation discussed, pt educated on plan of care, HEP initiated.                                                                                                                                 PATIENT EDUCATION:  Education details: Access Code: [email or web address] Person educated: Patient Education method: Explanation, Demonstration, and Handouts Education comprehension: verbalized understanding and returned demonstration  HOME EXERCISE PROGRAM: Access Code: [email or web address] URL: [email or web address] Date: 04/12/2024 Prepared by: Loetta Ringer  Exercises - Active Straight Leg Raise with Quad Set  - 2 x daily - 7 x weekly - 1-2 sets - 10 reps - Seated Sciatic Tensioner  - 5 x daily - 7 x weekly - 1 sets - 10 reps - Seated Figure 4 Piriformis Stretch  - 3 x daily - 7 x weekly - 1 sets - 3 reps - 20 hold - Seated Hamstring Stretch  - 3 x daily - 7 x weekly - 1 sets - 3 reps  - 20 hold  ASSESSMENT:  CLINICAL IMPRESSION: Patient is a 73 y.o. female who was seen today for physical therapy evaluation and treatment for LBP and Lt LE pain. Recent MRI showed central canal stenosis at L3-L5. Pt with full lumbar A/ROM without directional preference with repeated motion.  Bil hip flexibility is limited with pain reported on the Rt.  Tension in bil lumbar paraspinals with reduced segmental mobility without pain.  Some instability on Rt LE with single leg stance but not significantly different than Lt.  Pt reports sleep limitation and pain with negotiating steps.  Patient will benefit from skilled PT to address the below impairments and improve overall function.   OBJECTIVE IMPAIRMENTS: decreased activity tolerance, decreased mobility, decreased strength, increased muscle spasms, impaired flexibility, improper body mechanics, postural dysfunction, and pain.   ACTIVITY LIMITATIONS: carrying, lifting, standing, sleeping, stairs, bed mobility, and locomotion level  PARTICIPATION LIMITATIONS: cleaning, laundry, and community activity  PERSONAL FACTORS: Time since onset of injury/illness/exacerbation and 1 comorbidity: lumbar DDD/stenosis   are also affecting patient's functional outcome.   REHAB POTENTIAL: Good  CLINICAL DECISION MAKING: Stable/uncomplicated  EVALUATION COMPLEXITY: Low   GOALS: Goals reviewed with patient? Yes  SHORT TERM GOALS: Target date: 05/10/2024    Be independent in initial HEP Baseline: Goal status: INITIAL  2.  Report > or = to 25% fewer sleep interruptions due to LBP/Rt LE pain Baseline:  Goal status: INITIAL  3.  Negotiate steps with > or = to 30% reduced Rt LE pain Baseline:  Goal status: INITIAL  4.  Verbalize and demonstrate body mechanics modifications for lumbar protection with daily tasks  Baseline:  Goal status: INITIAL   LONG TERM GOALS: Target date: 06/07/2024    Be independent in advanced HEP Baseline:  Goal status:  INITIAL  2.  Report > or = to 75% fewer sleep interruptions due to LBP/Rt LE pain Baseline:  Goal status: INITIAL  3.  Improve Modified Oswestry to < or = to 10% disability  Baseline: 22% Goal status: INITIAL  4.  Negotiate steps with > or = to 70% reduced Rt LE pain Baseline:  Goal status: INITIAL  PLAN:  PT FREQUENCY: 2x/week  PT DURATION: 8 weeks  PLANNED INTERVENTIONS: 97110-Therapeutic exercises, 97530- Therapeutic activity, [id number]- Neuromuscular re-education, 97535- Self Care, [id number]- Manual therapy, [phone]- Gait training, [phone]- Canalith repositioning, [id number]- Aquatic Therapy, [phone]- Electrical stimulation (unattended), [phone]- Electrical stimulation (manual), Patient/Family education, Taping, Dry Needling, Joint mobilization, Joint manipulation, Spinal manipulation, Spinal mobilization, Cryotherapy, and Moist heat.  PLAN FOR NEXT SESSION: review HEP, dry needling if pt agrees, Rt hip and gluteal strength, core stability, hip and lumbar flexibility.    Luella Sager, PT 04/12/24 5:16 PM   [facility] [address] [place], [place] [address] Phone # [phone] Fax [phone]

## 2024-04-12 ENCOUNTER — Other Ambulatory Visit: Payer: Self-pay

## 2024-04-12 ENCOUNTER — Ambulatory Visit: Attending: Neurosurgery

## 2024-04-12 DIAGNOSIS — M79604 Pain in right leg: Secondary | ICD-10-CM | POA: Insufficient documentation

## 2024-04-12 DIAGNOSIS — R252 Cramp and spasm: Secondary | ICD-10-CM | POA: Diagnosis present

## 2024-04-12 DIAGNOSIS — M5459 Other low back pain: Secondary | ICD-10-CM | POA: Diagnosis present

## 2024-04-12 DIAGNOSIS — R262 Difficulty in walking, not elsewhere classified: Secondary | ICD-10-CM | POA: Insufficient documentation

## 2024-04-12 DIAGNOSIS — M6281 Muscle weakness (generalized): Secondary | ICD-10-CM | POA: Insufficient documentation

## 2024-04-17 ENCOUNTER — Ambulatory Visit

## 2024-04-17 DIAGNOSIS — M79604 Pain in right leg: Secondary | ICD-10-CM

## 2024-04-17 DIAGNOSIS — R252 Cramp and spasm: Secondary | ICD-10-CM

## 2024-04-17 DIAGNOSIS — M5459 Other low back pain: Secondary | ICD-10-CM | POA: Diagnosis not present

## 2024-04-17 DIAGNOSIS — M6281 Muscle weakness (generalized): Secondary | ICD-10-CM

## 2024-04-17 NOTE — Patient Instructions (Signed)

## 2024-04-17 NOTE — Therapy (Signed)
 OUTPATIENT PHYSICAL THERAPY TREATMENT   Patient Name: Andrea Mccann MRN: [id number] DOB:October 28, 1951, 73 y.o., female Today's Date: 04/17/2024  END OF SESSION:  PT End of Session - 04/17/24 0844     Visit Number 2    Date for PT Re-Evaluation 06/07/24    Authorization Type [employer]    Progress Note Due on Visit 10    PT Start Time 0800    PT Stop Time 0846    PT Time Calculation (min) 46 min    Activity Tolerance Patient tolerated treatment well    Behavior During Therapy [facility] for tasks assessed/performed              Past Medical History:  Diagnosis Date   TMJ disease    History reviewed. No pertinent surgical history. Patient Active Problem List   Diagnosis Date Noted   IRRITABLE BOWEL SYNDROME 04/09/2009   UTI 04/09/2009   ABDOMINAL PAIN, GENERALIZED 04/09/2009   HYPERLIPIDEMIA 04/08/2009   Essential hypertension 04/08/2009   GERD 04/08/2009   CHEST PAIN 04/08/2009    PCP: Yvonnie Heritage, FNP   REFERRING PROVIDER: Glennon Lao   REFERRING DIAG:  Diagnosis  M54.16 (ICD-10-CM) - Radiculopathy, lumbar region    Rationale for Evaluation and Treatment: Rehabilitation  THERAPY DIAG:  Other low back pain  Cramp and spasm  Pain in right leg  Muscle weakness (generalized)  ONSET DATE: off and on/chronic- ~6 months ago  SUBJECTIVE:                                                                                                                                                                                           SUBJECTIVE STATEMENT: Aleve helps me in the morning.  I've been walking and doing my exercises.    From MD visit on 03/28/24:  73 y.o. 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 These symptoms are worsened with prolonged walking consistent with neurogenic claudication. She has found relief  with muscle relaxants and NSAIDs, as well as heat. I recommended the addition of a dedicated course of PT to which she is agreeable. We also discussed the role of interventional procedures such as an L4/5 TFESI. For now she wishes to hold off on injection therapy, hopeful symptoms will continue to improve with the addition of PT.  PERTINENT HISTORY:  Rt mastectomy, lymphedema  PAIN: 04/17/24 Are you having pain? Yes: NPRS scale: 4/10 Pain location: low back and Rt LE Pain description: achy Aggravating factors:sitting on a hard surface, morning hours, sleep at  night, going up steps Relieving factors: heat, shower   PRECAUTIONS: None  RED FLAGS: None   WEIGHT BEARING RESTRICTIONS: No  FALLS:  Has patient fallen in last 6 months? 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  DIAGNOSTIC FINDINGS:  Lumbar MRI  IMPRESSION:  1. Multilevel facet arthropathy and disc bulging most significant at L4-5 causing moderate central canal stenosis.  2. Additional mild central canal stenosis is present at the levels of L3-4 and L5-S1.   PATIENT SURVEYS:  04/12/24: Modified Oswestry 11/50=22% disability    COGNITION: Overall cognitive status: Within functional limits for tasks assessed     SENSATION: WFL  MUSCLE LENGTH: Hamstrings limited by 50% bil with pain on Rt  POSTURE: rounded shoulders, forward head, and flexed trunk   PALPATION: Tension in bil lumbar paraspinals and bil gluteals, no pain reported with palpation   LUMBAR ROM:   Full lumbar A/ROM without increased pain.  No directional preference for  flexion/extension with repetition  LOWER EXTREMITY ROM:     Bil hamstrings and IR limited by 25-50% bilaterally   LOWER EXTREMITY MMT:    MMT Right eval Left eval  Hip flexion 4- 4+  Hip extension 4+ 4+  Hip abduction 4+ 4+  Hip adduction    Hip internal rotation    Hip external rotation    Knee flexion 5 5  Knee extension 5 5  Ankle dorsiflexion    Ankle plantarflexion    Ankle inversion    Ankle eversion     (Blank rows = not tested)  LUMBAR SPECIAL TESTS:  Slump test: Negative   GAIT: Distance walked: 50 Assistive device utilized: None Level of assistance: Complete Independence Comments: WNL  TREATMENT DATE:  04/17/24 NuStep: Level 5x 6 minutes-PT present to discuss progress  SLR with quad set 2x10 Seated figure 4 and hamstring stretch 3x20 seconds  Sciatic nerve floss x10 Sidelying clam with TA activation 2x20 seconds  Low trunk rotation: x5  Sit to stand with TA activation x10 Trigger Point Dry Needling  Initial Treatment: Pt instructed on Dry Needling rational, procedures, and possible side effects. Pt instructed to expect mild to moderate muscle soreness later in the day and/or into the next day.  Pt instructed in methods to reduce muscle soreness. Pt instructed to continue prescribed HEP. Because Dry Needling was performed over or adjacent to a lung field, pt was educated on S/S of pneumothorax and to seek immediate medical attention should they occur.  Patient was educated on signs and symptoms of infection and other risk factors and advised to seek medical attention should they occur.  Patient verbalized understanding of these instructions and education.   Patient Verbal Consent Given: Yes Education Handout Provided: Yes Muscles Treated: Rt gluteals, bil lumbar multifidi Electrical Stimulation Performed: No Treatment Response/Outcome: improved mobility and twitch Elongation and release after dry needling    04/12/24 Findings from evaluation  discussed, pt educated on plan of care, HEP initiated.  PATIENT EDUCATION:  Education details: Access Code: [email or web address], dry needling info (04/17/24) Person educated: Patient Education method: Explanation, Demonstration, and Handouts Education comprehension: verbalized understanding and returned demonstration  HOME EXERCISE PROGRAM:  Access Code: [email or web address] URL: [email or web address] Date: 04/17/2024 Prepared by: Loetta Ringer  Exercises - Active Straight Leg Raise with Quad Set  - 2 x daily - 7 x weekly - 1-2 sets - 10 reps - Seated Sciatic Tensioner  - 5 x daily - 7 x weekly - 1 sets - 10 reps - Seated Figure 4 Piriformis Stretch  - 3 x daily - 7 x weekly - 1 sets - 3 reps - 20 hold - Seated Hamstring Stretch  - 3 x daily - 7 x weekly - 1 sets - 3 reps - 20 hold - Seated Transversus Abdominis Bracing  - 1 x daily - 7 x weekly - 3 sets - 10 reps - Clamshell  - 2 x daily - 7 x weekly - 3 sets - 10 reps - Sit to Stand Without Arm Support  - 2 x daily - 7 x weekly - 2 sets - 10 reps - Supine Lower Trunk Rotation  - 3 x daily - 7 x weekly - 1 sets - 3 reps - 20 hold  ASSESSMENT:  CLINICAL IMPRESSION: First time follow-up after evaluation. Pt continues to walk regularly and has been consistent with her HEP.  Pt demonstrated all aspects of her HEP correctly today.  Good response to dry needling with improved tissue mobility and twitch response in gluteals and lumbar spine.  PT added TA and gluteal strength to HEP today.  Patient will benefit from skilled PT to address the below impairments and improve overall function.   OBJECTIVE IMPAIRMENTS: decreased activity tolerance, decreased mobility, decreased strength, increased muscle spasms, impaired flexibility, improper body mechanics, postural dysfunction, and pain.   ACTIVITY LIMITATIONS: carrying, lifting,  standing, sleeping, stairs, bed mobility, and locomotion level  PARTICIPATION LIMITATIONS: cleaning, laundry, and community activity  PERSONAL FACTORS: Time since onset of injury/illness/exacerbation and 1 comorbidity: lumbar DDD/stenosis   are also affecting patient's functional outcome.   REHAB POTENTIAL: Good  CLINICAL DECISION MAKING: Stable/uncomplicated  EVALUATION COMPLEXITY: Low   GOALS: Goals reviewed with patient? Yes  SHORT TERM GOALS: Target date: 05/10/2024    Be independent in initial HEP Baseline: Goal status: INITIAL  2.  Report > or = to 25% fewer sleep interruptions due to LBP/Rt LE pain Baseline:  Goal status: INITIAL  3.  Negotiate steps with > or = to 30% reduced Rt LE pain Baseline:  Goal status: INITIAL  4.  Verbalize and demonstrate body mechanics modifications for lumbar protection with daily tasks  Baseline:  Goal status: INITIAL   LONG TERM GOALS: Target date: 06/07/2024    Be independent in advanced HEP Baseline:  Goal status: INITIAL  2.  Report > or = to 75% fewer sleep interruptions due to LBP/Rt LE pain Baseline:  Goal status: INITIAL  3.  Improve Modified Oswestry to < or = to 10% disability  Baseline: 22% Goal status: INITIAL  4.  Negotiate steps with > or = to 70% reduced Rt LE pain Baseline:  Goal status: INITIAL  PLAN:  PT FREQUENCY: 2x/week  PT DURATION: 8 weeks  PLANNED INTERVENTIONS: 97110-Therapeutic exercises, 97530- Therapeutic activity, [id number]- Neuromuscular re-education, 97535- Self Care, 54098- Manual therapy, [phone]- Gait training, [phone]- Canalith repositioning, [id number]- Aquatic Therapy, [phone]- Electrical stimulation (unattended), [phone]- Electrical stimulation (manual), Patient/Family education, Taping, Dry Needling, Joint mobilization, Joint manipulation, Spinal manipulation,  Spinal mobilization, Cryotherapy, and Moist heat.  PLAN FOR NEXT SESSION: assess response to dry needling, Rt hip and gluteal strength,  core stability, hip and lumbar flexibility.    Luella Sager, PT 04/17/24 8:46 AM   [facility] [address], [address] [place], [place] [address] Phone # [phone] Fax [phone]

## 2024-04-19 ENCOUNTER — Ambulatory Visit

## 2024-04-19 DIAGNOSIS — R252 Cramp and spasm: Secondary | ICD-10-CM

## 2024-04-19 DIAGNOSIS — M6281 Muscle weakness (generalized): Secondary | ICD-10-CM

## 2024-04-19 DIAGNOSIS — M79604 Pain in right leg: Secondary | ICD-10-CM

## 2024-04-19 DIAGNOSIS — R262 Difficulty in walking, not elsewhere classified: Secondary | ICD-10-CM

## 2024-04-19 DIAGNOSIS — M5459 Other low back pain: Secondary | ICD-10-CM | POA: Diagnosis not present

## 2024-04-19 NOTE — Therapy (Signed)
 OUTPATIENT PHYSICAL THERAPY TREATMENT   Patient Name: Andrea Mccann MRN: [id number] DOB:July 30, 1951, 73 y.o., female Today's Date: 04/19/2024  END OF SESSION:  PT End of Session - 04/19/24 1020     Visit Number 3    Authorization Type [employer] Medicare    Progress Note Due on Visit 10    PT Start Time 1020    PT Stop Time 1100    PT Time Calculation (min) 40 min    Activity Tolerance Patient tolerated treatment well    Behavior During Therapy WFL for tasks assessed/performed              Past Medical History:  Diagnosis Date   TMJ disease    History reviewed. No pertinent surgical history. Patient Active Problem List   Diagnosis Date Noted   IRRITABLE BOWEL SYNDROME 04/09/2009   UTI 04/09/2009   ABDOMINAL PAIN, GENERALIZED 04/09/2009   HYPERLIPIDEMIA 04/08/2009   Essential hypertension 04/08/2009   GERD 04/08/2009   CHEST PAIN 04/08/2009    PCP: Yvonnie Heritage, FNP   REFERRING PROVIDER: Glennon Lao   REFERRING DIAG:  Diagnosis  M54.16 (ICD-10-CM) - Radiculopathy, lumbar region    Rationale for Evaluation and Treatment: Rehabilitation  THERAPY DIAG:  Other low back pain  Cramp and spasm  Pain in right leg  Muscle weakness (generalized)  Difficulty in walking, not elsewhere classified  ONSET DATE: off and on/chronic- ~6 months ago  SUBJECTIVE:                                                                                                                                                                                           SUBJECTIVE STATEMENT: Patient reports she is doing a little better.  The needling helped.  She continues to experience low back pain and some leg pain that goes down into the shin area.    From MD visit on 03/28/24:  73 y.o. 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 These  symptoms are worsened with prolonged walking consistent with neurogenic claudication. She has found relief with muscle relaxants and NSAIDs, as well as heat. I recommended the addition of a dedicated course of PT to which she is agreeable. We also discussed the role of interventional procedures such as an L4/5 TFESI. For now she wishes to hold off on injection therapy, hopeful symptoms will continue to improve with the addition of PT.  PERTINENT HISTORY:  Rt mastectomy, lymphedema  PAIN: 04/17/24 Are you having pain? Yes: NPRS scale: 4/10 Pain location: low back  and Rt LE Pain description: achy Aggravating factors:sitting on a hard surface, morning hours, sleep at night, going up steps Relieving factors: heat, shower   PRECAUTIONS: None  RED FLAGS: None   WEIGHT BEARING RESTRICTIONS: No  FALLS:  Has patient fallen in last 6 months? 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  DIAGNOSTIC FINDINGS:  Lumbar MRI  IMPRESSION:  1. Multilevel facet arthropathy and disc bulging most significant at L4-5 causing moderate central canal stenosis.  2. Additional mild central canal stenosis is present at the levels of L3-4 and L5-S1.   PATIENT SURVEYS:  04/12/24: Modified Oswestry 11/50=22% disability    COGNITION: Overall cognitive status: Within functional limits for tasks assessed     SENSATION: WFL  MUSCLE LENGTH: Hamstrings limited by 50% bil with pain on Rt  POSTURE: rounded shoulders, forward head, and flexed trunk   PALPATION: Tension in bil lumbar paraspinals and bil gluteals, no pain reported  with palpation   LUMBAR ROM:   Full lumbar A/ROM without increased pain.  No directional preference for flexion/extension with repetition  LOWER EXTREMITY ROM:     Bil hamstrings and IR limited by 25-50% bilaterally   LOWER EXTREMITY MMT:    MMT Right eval Left eval  Hip flexion 4- 4+  Hip extension 4+ 4+  Hip abduction 4+ 4+  Hip adduction    Hip internal rotation    Hip external rotation    Knee flexion 5 5  Knee extension 5 5  Ankle dorsiflexion    Ankle plantarflexion    Ankle inversion    Ankle eversion     (Blank rows = not tested)  LUMBAR SPECIAL TESTS:  Slump test: Negative   GAIT: Distance walked: 50 Assistive device utilized: None Level of assistance: Complete Independence Comments: WNL  TREATMENT DATE:  04/19/24 Discussed patient progress and educated on anatomy of the spine as patient has done a lot of "googling" her condition and she seems to have a lot of misunderstanding about her condition.  We educated her on anatomy and the behavior of the spinal discs,  stenosis vs bulging vs herniation vs DDD with regard to the disc, explained that her MRI shows significant disc bulge but only mild stenosis so we may try some Mckenzie extension with modifications for the stenosis to see if this centralizes her leg symptoms.  Patient understood and we proceeded with treatment Reviewed all of her current HEP: patient needed several corrections for technique Added quad stretching to HEP Added prone lying and prone on elbows and taught patient s/s to monitor for when this position is no longer appropriate.  (Patient did have centralization of her symptoms with prone positioning) Provided handouts with new exercises  04/17/24 NuStep: Level 5x 6 minutes-PT present to discuss progress  SLR with quad set 2x10 Seated figure 4 and hamstring stretch 3x20 seconds  Sciatic nerve floss x10 Sidelying clam with TA activation 2x20 seconds  Low trunk rotation: x5  Sit to stand  with TA activation x10 Trigger Point Dry Needling  Initial Treatment: Pt instructed on Dry Needling rational, procedures, and possible side effects. Pt instructed to expect mild to moderate muscle  soreness later in the day and/or into the next day.  Pt instructed in methods to reduce muscle soreness. Pt instructed to continue prescribed HEP. Because Dry Needling was performed over or adjacent to a lung field, pt was educated on S/S of pneumothorax and to seek immediate medical attention should they occur.  Patient was educated on signs and symptoms of infection and other risk factors and advised to seek medical attention should they occur.  Patient verbalized understanding of these instructions and education.   Patient Verbal Consent Given: Yes Education Handout Provided: Yes Muscles Treated: Rt gluteals, bil lumbar multifidi Electrical Stimulation Performed: No Treatment Response/Outcome: improved mobility and twitch Elongation and release after dry needling    04/12/24 Findings from evaluation discussed, pt educated on plan of care, HEP initiated.                                                                                                                                 PATIENT EDUCATION:  Education details: Access Code: [email or web address], dry needling info (04/17/24) Person educated: Patient Education method: Explanation, Demonstration, and Handouts Education comprehension: verbalized understanding and returned demonstration  HOME EXERCISE PROGRAM: Access Code: [email or web address] URL: [email or web address] Date: 04/19/2024 Prepared by: Aletha Anderson  Exercises - Active Straight Leg Raise with Quad Set  - 2 x daily - 7 x weekly - 1-2 sets - 10 reps - Seated Sciatic Tensioner  - 5 x daily - 7 x weekly - 1 sets - 10 reps - Seated Figure 4 Piriformis Stretch  - 3 x daily - 7 x weekly - 1 sets - 3 reps - 20 hold - Seated Hamstring Stretch  - 3 x daily - 7 x weekly - 1 sets - 3 reps  - 20 hold - Seated Transversus Abdominis Bracing  - 1 x daily - 7 x weekly - 3 sets - 10 reps - Clamshell  - 2 x daily - 7 x weekly - 3 sets - 10 reps - Sit to Stand Without Arm Support  - 2 x daily - 7 x weekly - 2 sets - 10 reps - Supine Lower Trunk Rotation  - 3 x daily - 7 x weekly - 1 sets - 3 reps - 20 hold - Quadricep Stretch with Chair and Counter Support  - 1 x daily - 7 x weekly - 3 sets - 10 reps - Lying Prone  - 1 x daily - 7 x weekly - 1 sets - 1 reps - 2 min hold - Static Prone on Elbows  - 1 x daily - 7 x weekly - 1 sets - 1 reps - 2 min hold  ASSESSMENT:  CLINICAL IMPRESSION: Bridgett arrives with improvement in overall symptoms.  However, after reviewing HEP, she seemed to need a lot of correction on technique.  We did a lot of education today as she seems to look up a lot of symptoms on the internet and she was feeling  confused at all of the information she had read.  We reviewed her MRI and since her stenotic areas were considered mild, we attempted some prone Mckenzie extension exercises.  She had centralization of her symptoms with this.  We went over s/s to watch for including any worsening of the symptoms.  She is well motivated and was very active prior to her current health issues.  She should continue to improve. She would benefit from continuing skilled PT for LE flexibility and core stabilization.     OBJECTIVE IMPAIRMENTS: decreased activity tolerance, decreased mobility, decreased strength, increased muscle spasms, impaired flexibility, improper body mechanics, postural dysfunction, and pain.   ACTIVITY LIMITATIONS: carrying, lifting, standing, sleeping, stairs, bed mobility, and locomotion level  PARTICIPATION LIMITATIONS: cleaning, laundry, and community activity  PERSONAL FACTORS: Time since onset of injury/illness/exacerbation and 1 comorbidity: lumbar DDD/stenosis   are also affecting patient's functional outcome.   REHAB POTENTIAL: Good  CLINICAL DECISION  MAKING: Stable/uncomplicated  EVALUATION COMPLEXITY: Low   GOALS: Goals reviewed with patient? Yes  SHORT TERM GOALS: Target date: 05/10/2024    Be independent in initial HEP Baseline: Goal status: INITIAL  2.  Report > or = to 25% fewer sleep interruptions due to LBP/Rt LE pain Baseline:  Goal status: INITIAL  3.  Negotiate steps with > or = to 30% reduced Rt LE pain Baseline:  Goal status: INITIAL  4.  Verbalize and demonstrate body mechanics modifications for lumbar protection with daily tasks  Baseline:  Goal status: INITIAL   LONG TERM GOALS: Target date: 06/07/2024    Be independent in advanced HEP Baseline:  Goal status: INITIAL  2.  Report > or = to 75% fewer sleep interruptions due to LBP/Rt LE pain Baseline:  Goal status: INITIAL  3.  Improve Modified Oswestry to < or = to 10% disability  Baseline: 22% Goal status: INITIAL  4.  Negotiate steps with > or = to 70% reduced Rt LE pain Baseline:  Goal status: INITIAL  PLAN:  PT FREQUENCY: 2x/week  PT DURATION: 8 weeks  PLANNED INTERVENTIONS: 97110-Therapeutic exercises, 97530- Therapeutic activity, [id number]- Neuromuscular re-education, 97535- Self Care, 60454- Manual therapy, [phone]- Gait training, [phone]- Canalith repositioning, [id number]- Aquatic Therapy, [phone]- Electrical stimulation (unattended), [phone]- Electrical stimulation (manual), Patient/Family education, Taping, Dry Needling, Joint mobilization, Joint manipulation, Spinal manipulation, Spinal mobilization, Cryotherapy, and Moist heat.  PLAN FOR NEXT SESSION: assess response to [facility] extension, Rt hip and gluteal strength, core stability, hip and lumbar flexibility.    Bridgette Campus B. Alverta Caccamo, PT 04/19/24 3:22 PM [facility] [address], [address] [place], [place] [address] Phone # [phone] Fax [phone]

## 2024-04-24 ENCOUNTER — Encounter: Payer: Self-pay | Admitting: Physical Therapy

## 2024-04-24 ENCOUNTER — Ambulatory Visit: Attending: Neurosurgery | Admitting: Physical Therapy

## 2024-04-24 DIAGNOSIS — R262 Difficulty in walking, not elsewhere classified: Secondary | ICD-10-CM | POA: Insufficient documentation

## 2024-04-24 DIAGNOSIS — M5459 Other low back pain: Secondary | ICD-10-CM | POA: Insufficient documentation

## 2024-04-24 DIAGNOSIS — M79604 Pain in right leg: Secondary | ICD-10-CM | POA: Diagnosis present

## 2024-04-24 DIAGNOSIS — R252 Cramp and spasm: Secondary | ICD-10-CM | POA: Insufficient documentation

## 2024-04-24 DIAGNOSIS — M6281 Muscle weakness (generalized): Secondary | ICD-10-CM | POA: Diagnosis present

## 2024-04-24 NOTE — Therapy (Signed)
 OUTPATIENT PHYSICAL THERAPY TREATMENT   Patient Name: Andrea Mccann MRN: [id number] DOB:21-Sep-1951, 73 y.o., female Today's Date: 04/24/2024  END OF SESSION:  PT End of Session - 04/24/24 1149     Visit Number 4    Authorization Type [employer]    Progress Note Due on Visit 10    PT Start Time 1145    PT Stop Time 1228    PT Time Calculation (min) 43 min    Activity Tolerance Patient tolerated treatment well    Behavior During Therapy WFL for tasks assessed/performed               Past Medical History:  Diagnosis Date   TMJ disease    History reviewed. No pertinent surgical history. Patient Active Problem List   Diagnosis Date Noted   IRRITABLE BOWEL SYNDROME 04/09/2009   UTI 04/09/2009   ABDOMINAL PAIN, GENERALIZED 04/09/2009   HYPERLIPIDEMIA 04/08/2009   Essential hypertension 04/08/2009   GERD 04/08/2009   CHEST PAIN 04/08/2009    PCP: Yvonnie Heritage, FNP   REFERRING PROVIDER: Glennon Lao   REFERRING DIAG:  Diagnosis  M54.16 (ICD-10-CM) - Radiculopathy, lumbar region    Rationale for Evaluation and Treatment: Rehabilitation  THERAPY DIAG:  Other low back pain  Cramp and spasm  Pain in right leg  Muscle weakness (generalized)  Difficulty in walking, not elsewhere classified  ONSET DATE: off and on/chronic- ~6 months ago  SUBJECTIVE:                                                                                                                                                                                           SUBJECTIVE STATEMENT: Soreness in the the right leg and hip and it's worse at night and in the morning. I had three nights where it just throbbed.  From MD visit on 03/28/24:  73 y.o. 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 These symptoms are worsened with prolonged walking  consistent with neurogenic claudication. She has found relief with muscle relaxants and NSAIDs, as well as heat. I recommended the addition of a dedicated course of PT to which she is agreeable. We also discussed the role of interventional procedures such as an L4/5 TFESI. For now she wishes to hold off on injection therapy, hopeful symptoms will continue to improve with the addition of PT.  PERTINENT HISTORY:  Rt mastectomy, lymphedema  PAIN: 04/17/24 Are you having pain? Yes: NPRS scale: 4/10 Pain location: low back and Rt LE Pain description: achy Aggravating  factors:sitting on a hard surface, morning hours, sleep at night, going up steps Relieving factors: heat, shower   PRECAUTIONS: None  RED FLAGS: None   WEIGHT BEARING RESTRICTIONS: No  FALLS:  Has patient fallen in last 6 months? 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  DIAGNOSTIC FINDINGS:  Lumbar MRI  IMPRESSION:  1. Multilevel facet arthropathy and disc bulging most significant at L4-5 causing moderate central canal stenosis.  2. Additional mild central canal stenosis is present at the levels of L3-4 and L5-S1.   PATIENT SURVEYS:  04/12/24: Modified Oswestry 11/50=22% disability    COGNITION: Overall cognitive status: Within functional limits for tasks assessed     SENSATION: WFL  MUSCLE LENGTH: Hamstrings limited by 50% bil with pain on Rt  POSTURE: rounded shoulders, forward head, and flexed trunk   PALPATION: Tension in bil lumbar paraspinals and bil gluteals, no pain reported with palpation   LUMBAR ROM:   Full lumbar  A/ROM without increased pain.  No directional preference for flexion/extension with repetition  LOWER EXTREMITY ROM:     Bil hamstrings and IR limited by 25-50% bilaterally   LOWER EXTREMITY MMT:    MMT Right eval Left eval  Hip flexion 4- 4+  Hip extension 4+ 4+  Hip abduction 4+ 4+  Hip adduction    Hip internal rotation    Hip external rotation    Knee flexion 5 5  Knee extension 5 5  Ankle dorsiflexion    Ankle plantarflexion    Ankle inversion    Ankle eversion     (Blank rows = not tested)  LUMBAR SPECIAL TESTS:  Slump test: Negative   GAIT: Distance walked: 50 Assistive device utilized: None Level of assistance: Complete Independence Comments: WNL  TREATMENT DATE:  04/24/24 NuStep: Level 5x 6 minutes-PT present to discuss progress  quad stretch at mat table set higher than chair TA contraction in hooklying - used pelvic floor contraction to help pt isolate from Rectus Abdominus TA contraction with sequential march x 5 B Step up 6 inch assessed as pt reports pain with stair climbing. Reassessed post DN and no pain. Trigger Point Dry Needling  Subsequent Treatment: Instructions provided previously at initial dry needling treatment.   Patient Verbal Consent Given: Yes Education Handout Provided: Previously Provided Muscles Treated: R gluteals, piriformis and lumbar multifidi Electrical Stimulation Performed: No Treatment Response/Outcome: Utilized skilled palpation to identify bony landmarks and trigger points.  Able to illicit twitch response and muscle elongation.  Soft tissue mobilization to R gluteals following DN to further promote tissue elongation and decreased pain.       04/19/24 Discussed patient progress and educated on anatomy of the spine as patient has done a lot of "googling" her condition and she seems to have a lot of misunderstanding about her condition.  We educated her on anatomy and the behavior of the spinal discs,  stenosis vs bulging vs  herniation vs DDD with regard to the disc, explained that her MRI shows significant disc bulge but only mild stenosis so we may try some Mckenzie extension with modifications for the stenosis to see if this centralizes her leg symptoms.  Patient understood and we proceeded with treatment Reviewed all of her current HEP: patient needed several corrections for technique Added quad stretching to HEP Added prone lying and prone on elbows and taught patient s/s to monitor for when this position is no longer appropriate.  (Patient did have centralization of her symptoms with prone positioning) Provided handouts with new exercises  04/17/24 NuStep: Level 5x 6 minutes-PT present to discuss progress  SLR with quad set 2x10 Seated figure 4 and hamstring stretch 3x20 seconds  Sciatic nerve floss x10 Sidelying clam with TA activation 2x20 seconds  Low trunk rotation: x5  Sit to stand with TA activation x10 Trigger Point Dry Needling  Initial Treatment: Pt instructed on Dry Needling rational, procedures, and possible side effects. Pt instructed to expect mild to moderate muscle soreness later in the day and/or into the next day.  Pt instructed in methods to reduce muscle soreness. Pt instructed to continue prescribed HEP. Because Dry Needling was performed over or adjacent to a lung field, pt was educated on S/S of pneumothorax and to seek immediate medical attention should they occur.  Patient was educated on signs and symptoms of infection and other risk factors and advised to seek medical attention should they occur.  Patient verbalized understanding of these instructions and education.   Patient Verbal Consent Given: Yes Education Handout Provided: Yes Muscles Treated: Rt gluteals, bil lumbar multifidi Electrical Stimulation Performed: No Treatment Response/Outcome: improved mobility and twitch Elongation and release after dry needling    04/12/24 Findings from evaluation discussed, pt educated  on plan of care, HEP initiated.                                                                                                                                 PATIENT EDUCATION:  Education details: Access Code: [email or web address], dry needling info (04/17/24) Person educated: Patient Education method: Explanation, Demonstration, and Handouts Education comprehension: verbalized understanding and returned demonstration  HOME EXERCISE PROGRAM: Access Code: [email or web address] URL: [email or web address] Date: 04/19/2024 Prepared by: Aletha Anderson  Exercises - Active Straight Leg Raise with Quad Set  - 2 x daily - 7 x weekly - 1-2 sets - 10 reps - Seated Sciatic Tensioner  - 5 x daily - 7 x weekly - 1 sets - 10 reps - Seated Figure 4 Piriformis Stretch  - 3 x daily - 7 x weekly - 1 sets - 3 reps - 20 hold - Seated Hamstring Stretch  - 3 x daily - 7 x weekly - 1 sets - 3 reps - 20 hold - Seated Transversus Abdominis Bracing  - 1 x daily - 7 x weekly - 3 sets - 10 reps - Clamshell  - 2 x daily - 7 x weekly - 3 sets - 10 reps - Sit to Stand Without Arm Support  - 2 x daily - 7 x weekly - 2 sets - 10 reps - Supine Lower Trunk Rotation  -  3 x daily - 7 x weekly - 1 sets - 3 reps - 20 hold - Quadricep Stretch with Chair and Counter Support  - 1 x daily - 7 x weekly - 3 sets - 10 reps - Lying Prone  - 1 x daily - 7 x weekly - 1 sets - 1 reps - 2 min hold - Static Prone on Elbows  - 1 x daily - 7 x weekly - 1 sets - 1 reps - 2 min hold  ASSESSMENT:  CLINICAL IMPRESSION: Kassady reports she has been worse since last visit. She states her hip is really sore. We reviewed her HEP and modified her clams to every other day, her SLR to 1x/day and her quad stretch to a higher surface. She reports that stair climbing increases her R LE pain. This resolved after DN/MT to her hip muscles. We spent time working to isolate her TA which worked best with engaging her pelvic floor.  She reports the prone lying an prone on  elbows feels good.  OBJECTIVE IMPAIRMENTS: decreased activity tolerance, decreased mobility, decreased strength, increased muscle spasms, impaired flexibility, improper body mechanics, postural dysfunction, and pain.   ACTIVITY LIMITATIONS: carrying, lifting, standing, sleeping, stairs, bed mobility, and locomotion level  PARTICIPATION LIMITATIONS: cleaning, laundry, and community activity  PERSONAL FACTORS: Time since onset of injury/illness/exacerbation and 1 comorbidity: lumbar DDD/stenosis   are also affecting patient's functional outcome.   REHAB POTENTIAL: Good  CLINICAL DECISION MAKING: Stable/uncomplicated  EVALUATION COMPLEXITY: Low   GOALS: Goals reviewed with patient? Yes  SHORT TERM GOALS: Target date: 05/10/2024    Be independent in initial HEP Baseline: Goal status: INITIAL  2.  Report > or = to 25% fewer sleep interruptions due to LBP/Rt LE pain Baseline:  Goal status: INITIAL  3.  Negotiate steps with > or = to 30% reduced Rt LE pain Baseline:  Goal status: INITIAL  4.  Verbalize and demonstrate body mechanics modifications for lumbar protection with daily tasks  Baseline:  Goal status: INITIAL   LONG TERM GOALS: Target date: 06/07/2024    Be independent in advanced HEP Baseline:  Goal status: INITIAL  2.  Report > or = to 75% fewer sleep interruptions due to LBP/Rt LE pain Baseline:  Goal status: INITIAL  3.  Improve Modified Oswestry to < or = to 10% disability  Baseline: 22% Goal status: INITIAL  4.  Negotiate steps with > or = to 70% reduced Rt LE pain Baseline:  Goal status: INITIAL  PLAN:  PT FREQUENCY: 2x/week  PT DURATION: 8 weeks  PLANNED INTERVENTIONS: 97110-Therapeutic exercises, 97530- Therapeutic activity, [id number]- Neuromuscular re-education, 97535- Self Care, [id number]- Manual therapy, [phone]- Gait training, [phone]- Canalith repositioning, [id number]- Aquatic Therapy, [phone]- Electrical stimulation (unattended), [phone]- Electrical  stimulation (manual), Patient/Family education, Taping, Dry Needling, Joint mobilization, Joint manipulation, Spinal manipulation, Spinal mobilization, Cryotherapy, and Moist heat.  PLAN FOR NEXT SESSION: assess response to [facility] extension, Rt hip and gluteal strength, core stability, hip and lumbar flexibility.    Jinx Mourning, PT  04/24/24 1:41 PM [facility] [address] [place], [place] [address] Phone # [phone] Fax [phone]

## 2024-04-25 NOTE — Therapy (Signed)
 OUTPATIENT PHYSICAL THERAPY TREATMENT   Patient Name: Andrea Mccann MRN: [id number] DOB:1951/12/19, 73 y.o., female Today's Date: 04/26/2024  END OF SESSION:  PT End of Session - 04/26/24 1101     Visit Number 5    Date for PT Re-Evaluation 06/07/24    Authorization Type [employer]    Progress Note Due on Visit 10    PT Start Time 1102    PT Stop Time 1142    PT Time Calculation (min) 40 min    Activity Tolerance Patient tolerated treatment well    Behavior During Therapy WFL for tasks assessed/performed                Past Medical History:  Diagnosis Date   TMJ disease    History reviewed. No pertinent surgical history. Patient Active Problem List   Diagnosis Date Noted   IRRITABLE BOWEL SYNDROME 04/09/2009   UTI 04/09/2009   ABDOMINAL PAIN, GENERALIZED 04/09/2009   HYPERLIPIDEMIA 04/08/2009   Essential hypertension 04/08/2009   GERD 04/08/2009   CHEST PAIN 04/08/2009    PCP: Yvonnie Heritage, FNP   REFERRING PROVIDER: Glennon Lao   REFERRING DIAG:  Diagnosis  M54.16 (ICD-10-CM) - Radiculopathy, lumbar region    Rationale for Evaluation and Treatment: Rehabilitation  THERAPY DIAG:  Other low back pain  Cramp and spasm  Pain in right leg  Muscle weakness (generalized)  ONSET DATE: off and on/chronic- ~6 months ago  SUBJECTIVE:                                                                                                                                                                                           SUBJECTIVE STATEMENT: Pain in the hip 4/10, no pain in the back. Yesterday I started feeling more in the hip again with steps and this morning.   From MD visit on 03/28/24:  73 y.o. 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 These symptoms are worsened with prolonged walking consistent  with neurogenic claudication. She has found relief with muscle relaxants and NSAIDs, as well as heat. I recommended the addition of a dedicated course of PT to which she is agreeable. We also discussed the role of interventional procedures such as an L4/5 TFESI. For now she wishes to hold off on injection therapy, hopeful symptoms will continue to improve with the addition of PT.  PERTINENT HISTORY:  Rt mastectomy, lymphedema  PAIN: 04/17/24 Are you having pain? Yes: NPRS scale: 4/10 Pain location: low back and Rt LE Pain description:  achy Aggravating factors:sitting on a hard surface, morning hours, sleep at night, going up steps Relieving factors: heat, shower   PRECAUTIONS: None  RED FLAGS: None   WEIGHT BEARING RESTRICTIONS: No  FALLS:  Has patient fallen in last 6 months? 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  DIAGNOSTIC FINDINGS:  Lumbar MRI  IMPRESSION:  1. Multilevel facet arthropathy and disc bulging most significant at L4-5 causing moderate central canal stenosis.  2. Additional mild central canal stenosis is present at the levels of L3-4 and L5-S1.   PATIENT SURVEYS:  04/12/24: Modified Oswestry 11/50=22% disability    COGNITION: Overall cognitive status: Within functional limits for tasks assessed     SENSATION: WFL  MUSCLE LENGTH: Hamstrings limited by 50% bil with pain on Rt  POSTURE: rounded shoulders, forward head, and flexed trunk   PALPATION: Tension in bil lumbar paraspinals and bil gluteals, no pain reported with palpation   LUMBAR ROM:   Full lumbar A/ROM  without increased pain.  No directional preference for flexion/extension with repetition  LOWER EXTREMITY ROM:     Bil hamstrings and IR limited by 25-50% bilaterally   LOWER EXTREMITY MMT:    MMT Right eval Left eval  Hip flexion 4- 4+  Hip extension 4+ 4+  Hip abduction 4+ 4+  Hip adduction    Hip internal rotation    Hip external rotation    Knee flexion 5 5  Knee extension 5 5  Ankle dorsiflexion    Ankle plantarflexion    Ankle inversion    Ankle eversion     (Blank rows = not tested)  LUMBAR SPECIAL TESTS:  Slump test: Negative   GAIT: Distance walked: 50 Assistive device utilized: None Level of assistance: Complete Independence Comments: WNL  TREATMENT DATE:  04/26/24 Prone lying x 1 min no change Prone on elbows x 1 min x 2 reps abolishes leg pain TA contraction in hooklying -  TA contraction with sequential march still too challenging TA contraction with single march x 10 B TA contraction with SLR x 10 B Bridge with alternating knee ext x 10 ea Supine glute stretch x 30 sec Pelvis assessed - ant R innominate MET performed with dowel 5 sec hold x 3 not fully corrected MET performed with R foot on PT shoulder and Leg off EOB 3 sec x 3 Educated on anatomy and MET   04/24/24 NuStep: Level 5x 6 minutes-PT present to discuss progress  quad stretch at mat table set higher than chair TA contraction in hooklying - used pelvic floor contraction to help pt isolate from Rectus Abdominus TA contraction with sequential march x 5 B Step up 6 inch assessed as pt reports pain with stair climbing. Reassessed post DN and no pain. Trigger Point Dry Needling  Subsequent Treatment: Instructions provided previously at initial dry needling treatment.   Patient Verbal Consent Given: Yes Education Handout Provided: Previously Provided Muscles Treated: R gluteals, piriformis and lumbar multifidi Electrical Stimulation Performed: No Treatment Response/Outcome: Utilized  skilled palpation to identify bony landmarks and trigger points.  Able to illicit twitch response and muscle elongation.  Soft tissue mobilization to R gluteals following  DN to further promote tissue elongation and decreased pain.       04/19/24 Discussed patient progress and educated on anatomy of the spine as patient has done a lot of "googling" her condition and she seems to have a lot of misunderstanding about her condition.  We educated her on anatomy and the behavior of the spinal discs,  stenosis vs bulging vs herniation vs DDD with regard to the disc, explained that her MRI shows significant disc bulge but only mild stenosis so we may try some Mckenzie extension with modifications for the stenosis to see if this centralizes her leg symptoms.  Patient understood and we proceeded with treatment Reviewed all of her current HEP: patient needed several corrections for technique Added quad stretching to HEP Added prone lying and prone on elbows and taught patient s/s to monitor for when this position is no longer appropriate.  (Patient did have centralization of her symptoms with prone positioning) Provided handouts with new exercises  04/17/24 NuStep: Level 5x 6 minutes-PT present to discuss progress  SLR with quad set 2x10 Seated figure 4 and hamstring stretch 3x20 seconds  Sciatic nerve floss x10 Sidelying clam with TA activation 2x20 seconds  Low trunk rotation: x5  Sit to stand with TA activation x10 Trigger Point Dry Needling  Initial Treatment: Pt instructed on Dry Needling rational, procedures, and possible side effects. Pt instructed to expect mild to moderate muscle soreness later in the day and/or into the next day.  Pt instructed in methods to reduce muscle soreness. Pt instructed to continue prescribed HEP. Because Dry Needling was performed over or adjacent to a lung field, pt was educated on S/S of pneumothorax and to seek immediate medical attention should they occur.   Patient was educated on signs and symptoms of infection and other risk factors and advised to seek medical attention should they occur.  Patient verbalized understanding of these instructions and education.   Patient Verbal Consent Given: Yes Education Handout Provided: Yes Muscles Treated: Rt gluteals, bil lumbar multifidi Electrical Stimulation Performed: No Treatment Response/Outcome: improved mobility and twitch Elongation and release after dry needling    04/12/24 Findings from evaluation discussed, pt educated on plan of care, HEP initiated.                                                                                                                                 PATIENT EDUCATION:  Education details: Access Code: [email or web address], dry needling info (04/17/24) Person educated: Patient Education method: Explanation, Demonstration, and Handouts Education comprehension: verbalized understanding and returned demonstration  HOME EXERCISE PROGRAM: Access Code: [email or web address] URL: [email or web address] Date: 04/26/2024 Prepared by: Concha Deed  Exercises - Active Straight Leg Raise with Quad Set  - 1 x daily - 7 x weekly - 1-2 sets - 10 reps - Seated Sciatic Tensioner  - 5 x daily - 7 x weekly - 1 sets - 10 reps - Seated Figure 4 Piriformis Stretch  -  3 x daily - 7 x weekly - 1 sets - 3 reps - 20 hold - Seated Hamstring Stretch  - 3 x daily - 7 x weekly - 1 sets - 3 reps - 20 hold - Seated Transversus Abdominis Bracing  - 1 x daily - 7 x weekly - 3 sets - 10 reps - Clamshell  - 2 x daily - 3 x weekly - 3 sets - 10 reps - Sit to Stand Without Arm Support  - 2 x daily - 7 x weekly - 2 sets - 10 reps - Supine Lower Trunk Rotation  - 3 x daily - 7 x weekly - 1 sets - 3 reps - 20 hold - Quadricep Stretch with Chair and Counter Support  - 1 x daily - 7 x weekly - 1 sets - 3 reps - 30 sec hold - Lying Prone  - 1 x daily - 7 x weekly - 1 sets - 1 reps - 2 min hold - Static Prone on Elbows   - 1 x daily - 7 x weekly - 1 sets - 1 reps - 2 min hold - 90/90 SI Joint Self-Correction with Dowel  - 1-2 x daily - 7 x weekly - 1 sets - 3 reps - 5 sec hold  ASSESSMENT:  CLINICAL IMPRESSION: Voula presents with decrease in low back pain but ongoing hip pain. Assessed pelvic landmarks and she demonstrates ant R innominate. This was corrected with MET. Patient advised to do self correction 1x/day for one week. Assess next visit and strengthen to maintain alignment.   OBJECTIVE IMPAIRMENTS: decreased activity tolerance, decreased mobility, decreased strength, increased muscle spasms, impaired flexibility, improper body mechanics, postural dysfunction, and pain.   ACTIVITY LIMITATIONS: carrying, lifting, standing, sleeping, stairs, bed mobility, and locomotion level  PARTICIPATION LIMITATIONS: cleaning, laundry, and community activity  PERSONAL FACTORS: Time since onset of injury/illness/exacerbation and 1 comorbidity: lumbar DDD/stenosis   are also affecting patient's functional outcome.   REHAB POTENTIAL: Good  CLINICAL DECISION MAKING: Stable/uncomplicated  EVALUATION COMPLEXITY: Low   GOALS: Goals reviewed with patient? Yes  SHORT TERM GOALS: Target date: 05/10/2024    Be independent in initial HEP Baseline: Goal status: INITIAL  2.  Report > or = to 25% fewer sleep interruptions due to LBP/Rt LE pain Baseline:  Goal status: INITIAL  3.  Negotiate steps with > or = to 30% reduced Rt LE pain Baseline:  Goal status: INITIAL  4.  Verbalize and demonstrate body mechanics modifications for lumbar protection with daily tasks  Baseline:  Goal status: INITIAL   LONG TERM GOALS: Target date: 06/07/2024    Be independent in advanced HEP Baseline:  Goal status: INITIAL  2.  Report > or = to 75% fewer sleep interruptions due to LBP/Rt LE pain Baseline:  Goal status: INITIAL  3.  Improve Modified Oswestry to < or = to 10% disability  Baseline: 22% Goal status:  INITIAL  4.  Negotiate steps with > or = to 70% reduced Rt LE pain Baseline:  Goal status: INITIAL  PLAN:  PT FREQUENCY: 2x/week  PT DURATION: 8 weeks  PLANNED INTERVENTIONS: 97110-Therapeutic exercises, 97530- Therapeutic activity, [id number]- Neuromuscular re-education, 97535- Self Care, [address]- Manual therapy, [phone]- Gait training, [phone]- Canalith repositioning, [id number]- Aquatic Therapy, [phone]- Electrical stimulation (unattended), [phone]- Electrical stimulation (manual), Patient/Family education, Taping, Dry Needling, Joint mobilization, Joint manipulation, Spinal manipulation, Spinal mobilization, Cryotherapy, and Moist heat.  PLAN FOR NEXT SESSION: assess response to Mckenzie extension, Rt hip and gluteal strength, core  stability, hip and lumbar flexibility.    Jinx Mourning, PT  04/26/24 11:45 AM [facility] [address] [place], [place] [address] Phone # [phone] Fax [phone]

## 2024-04-26 ENCOUNTER — Ambulatory Visit: Admitting: Physical Therapy

## 2024-04-26 ENCOUNTER — Encounter: Payer: Self-pay | Admitting: Physical Therapy

## 2024-04-26 DIAGNOSIS — M5459 Other low back pain: Secondary | ICD-10-CM | POA: Diagnosis not present

## 2024-04-26 DIAGNOSIS — R252 Cramp and spasm: Secondary | ICD-10-CM

## 2024-04-26 DIAGNOSIS — M6281 Muscle weakness (generalized): Secondary | ICD-10-CM

## 2024-04-26 DIAGNOSIS — M79604 Pain in right leg: Secondary | ICD-10-CM

## 2024-05-01 ENCOUNTER — Encounter: Admitting: Physical Therapy

## 2024-05-02 ENCOUNTER — Ambulatory Visit

## 2024-05-02 DIAGNOSIS — M5459 Other low back pain: Secondary | ICD-10-CM | POA: Diagnosis not present

## 2024-05-02 DIAGNOSIS — M79604 Pain in right leg: Secondary | ICD-10-CM

## 2024-05-02 DIAGNOSIS — R252 Cramp and spasm: Secondary | ICD-10-CM

## 2024-05-02 DIAGNOSIS — M6281 Muscle weakness (generalized): Secondary | ICD-10-CM

## 2024-05-02 NOTE — Therapy (Signed)
 OUTPATIENT PHYSICAL THERAPY TREATMENT   Patient Name: Andrea Mccann MRN: [id number] DOB:07/06/1951, 73 y.o., female Today's Date: 05/02/2024  END OF SESSION:  PT End of Session - 05/02/24 1101     Visit Number 6    Date for PT Re-Evaluation 06/07/24    Authorization Type [employer] Medicare    Progress Note Due on Visit 10    PT Start Time 1017    PT Stop Time 1059    PT Time Calculation (min) 42 min    Activity Tolerance Patient tolerated treatment well    Behavior During Therapy WFL for tasks assessed/performed                 Past Medical History:  Diagnosis Date   TMJ disease    History reviewed. No pertinent surgical history. Patient Active Problem List   Diagnosis Date Noted   IRRITABLE BOWEL SYNDROME 04/09/2009   UTI 04/09/2009   ABDOMINAL PAIN, GENERALIZED 04/09/2009   HYPERLIPIDEMIA 04/08/2009   Essential hypertension 04/08/2009   GERD 04/08/2009   CHEST PAIN 04/08/2009    PCP: Yvonnie Heritage, FNP   REFERRING PROVIDER: Glennon Lao   REFERRING DIAG:  Diagnosis  M54.16 (ICD-10-CM) - Radiculopathy, lumbar region    Rationale for Evaluation and Treatment: Rehabilitation  THERAPY DIAG:  Cramp and spasm  Pain in right leg  Other low back pain  Muscle weakness (generalized)  ONSET DATE: off and on/chronic- ~6 months ago  SUBJECTIVE:                                                                                                                                                                                           SUBJECTIVE STATEMENT: I feel 40-50% better over all.  Back pain is improved and Rt glute pain is still there.  I had a couple of good days.  I am doing pelvic alignment techniques.   From MD visit on 03/28/24:  73 y.o. 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 These symptoms are  worsened with prolonged walking consistent with neurogenic claudication. She has found relief with muscle relaxants and NSAIDs, as well as heat. I recommended the addition of a dedicated course of PT to which she is agreeable. We also discussed the role of interventional procedures such as an L4/5 TFESI. For now she wishes to hold off on injection therapy, hopeful symptoms will continue to improve with the addition of PT.  PERTINENT HISTORY:  Rt mastectomy, lymphedema  PAIN: 05/02/24 Are you having pain? Yes: NPRS scale:  4/10 Pain location: low back and Rt LE Pain description: achy Aggravating factors:sitting on a hard surface, morning hours, sleep at night, going up steps Relieving factors: ice after exercise  PRECAUTIONS: None  RED FLAGS: None   WEIGHT BEARING RESTRICTIONS: No  FALLS:  Has patient fallen in last 6 months? 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  DIAGNOSTIC FINDINGS:  Lumbar MRI  IMPRESSION:  1. Multilevel facet arthropathy and disc bulging most significant at L4-5 causing moderate central canal stenosis.  2. Additional mild central canal stenosis is present at the levels of L3-4 and L5-S1.   PATIENT SURVEYS:  04/12/24: Modified Oswestry 11/50=22% disability    COGNITION: Overall cognitive status: Within functional limits for tasks assessed     SENSATION: WFL  MUSCLE LENGTH: Hamstrings limited by 50% bil with pain on Rt  POSTURE: rounded shoulders, forward head, and flexed trunk   PALPATION: Tension in bil lumbar paraspinals and bil gluteals, no pain reported with  palpation   LUMBAR ROM:   Full lumbar A/ROM without increased pain.  No directional preference for flexion/extension with repetition  LOWER EXTREMITY ROM:     Bil hamstrings and IR limited by 25-50% bilaterally   LOWER EXTREMITY MMT:    MMT Right eval Left eval  Hip flexion 4- 4+  Hip extension 4+ 4+  Hip abduction 4+ 4+  Hip adduction    Hip internal rotation    Hip external rotation    Knee flexion 5 5  Knee extension 5 5  Ankle dorsiflexion    Ankle plantarflexion    Ankle inversion    Ankle eversion     (Blank rows = not tested)  LUMBAR SPECIAL TESTS:  Slump test: Negative   GAIT: Distance walked: 50 Assistive device utilized: None Level of assistance: Complete Independence Comments: WNL  TREATMENT DATE:   05/02/24 NuStep: Level 5x 8 minutes-PT present to discuss progress   TA contraction in hooklying - used tactile and verbal cues to instruct TA contraction with ball squeeze 5" hold x 10 TA contraction with sequential march 2x10  Trigger Point Dry Needling  Subsequent Treatment: Instructions provided previously at initial dry needling treatment.   Patient Verbal Consent Given: Yes Education Handout Provided: Previously Provided Muscles Treated: Rt gluteals, piriformis and lumbar multifidi Electrical Stimulation Performed: No Treatment Response/Outcome: Utilized skilled palpation to identify bony landmarks and trigger points.  Able to illicit twitch response and muscle elongation.  Soft tissue mobilization to R gluteals following DN to further promote tissue elongation and decreased pain.      04/26/24 Prone lying x 1 min no change Prone on elbows x 1 min x 2 reps abolishes leg pain TA contraction in hooklying -  TA contraction with sequential march still too challenging TA contraction with single march x 10 B TA contraction with SLR x 10 B Bridge with alternating knee ext x 10 ea Supine glute stretch x 30 sec Pelvis assessed - ant R  innominate MET performed with dowel 5 sec hold x 3 not fully corrected MET performed with R foot on PT shoulder and Leg off EOB 3 sec x 3 Educated on anatomy and  MET   04/24/24 NuStep: Level 5x 6 minutes-PT present to discuss progress  quad stretch at mat table set higher than chair TA contraction in hooklying - used pelvic floor contraction to help pt isolate from Rectus Abdominus TA contraction with sequential march x 5 B Step up 6 inch assessed as pt reports pain with stair climbing. Reassessed post DN and no pain. Trigger Point Dry Needling  Subsequent Treatment: Instructions provided previously at initial dry needling treatment.   Patient Verbal Consent Given: Yes Education Handout Provided: Previously Provided Muscles Treated: R gluteals, piriformis and lumbar multifidi Electrical Stimulation Performed: No Treatment Response/Outcome: Utilized skilled palpation to identify bony landmarks and trigger points.  Able to illicit twitch response and muscle elongation.  Soft tissue mobilization to R gluteals following DN to further promote tissue elongation and decreased pain.      PATIENT EDUCATION:  Education details: Access Code: [email or web address], dry needling info (04/17/24) Person educated: Patient Education method: Explanation, Demonstration, and Handouts Education comprehension: verbalized understanding and returned demonstration  HOME EXERCISE PROGRAM: Access Code: [email or web address] URL: [email or web address] Date: 04/26/2024 Prepared by: Concha Deed  Exercises - Active Straight Leg Raise with Quad Set  - 1 x daily - 7 x weekly - 1-2 sets - 10 reps - Seated Sciatic Tensioner  - 5 x daily - 7 x weekly - 1 sets - 10 reps - Seated Figure 4 Piriformis Stretch  - 3 x daily - 7 x weekly - 1 sets - 3 reps - 20 hold - Seated Hamstring Stretch  - 3 x daily - 7 x weekly - 1 sets - 3 reps - 20 hold - Seated Transversus Abdominis Bracing  - 1 x daily - 7 x weekly - 3 sets - 10 reps - Clamshell   - 2 x daily - 3 x weekly - 3 sets - 10 reps - Sit to Stand Without Arm Support  - 2 x daily - 7 x weekly - 2 sets - 10 reps - Supine Lower Trunk Rotation  - 3 x daily - 7 x weekly - 1 sets - 3 reps - 20 hold - Quadricep Stretch with Chair and Counter Support  - 1 x daily - 7 x weekly - 1 sets - 3 reps - 30 sec hold - Lying Prone  - 1 x daily - 7 x weekly - 1 sets - 1 reps - 2 min hold - Static Prone on Elbows  - 1 x daily - 7 x weekly - 1 sets - 1 reps - 2 min hold - 90/90 SI Joint Self-Correction with Dowel  - 1-2 x daily - 7 x weekly - 1 sets - 3 reps - 5 sec hold  ASSESSMENT:  CLINICAL IMPRESSION: 40-50% overall improvement in symptoms since the start of care. She has had more good days overall.  She is independent and compliant with HEP. Sleep is 50% improved since the start of care.  She is doing well with core activation and required some cueing today for technique. Good response to dry needling with improved tissue mobility and twitch response in all areas treated.    OBJECTIVE IMPAIRMENTS: decreased activity tolerance, decreased mobility, decreased strength, increased muscle spasms, impaired flexibility, improper body mechanics, postural dysfunction, and pain.   ACTIVITY LIMITATIONS: carrying, lifting, standing, sleeping, stairs, bed mobility, and locomotion level  PARTICIPATION LIMITATIONS: cleaning, laundry, and community activity  PERSONAL FACTORS: Time since onset of injury/illness/exacerbation and 1 comorbidity: lumbar DDD/stenosis  are also affecting patient's functional outcome.   REHAB  POTENTIAL: Good  CLINICAL DECISION MAKING: Stable/uncomplicated  EVALUATION COMPLEXITY: Low   GOALS: Goals reviewed with patient? Yes  SHORT TERM GOALS: Target date: 05/10/2024    Be independent in initial HEP Baseline: Goal status: MET  2.  Report > or = to 25% fewer sleep interruptions due to LBP/Rt LE pain Baseline: 50% (05/02/24) Goal status: in progress   3.  Negotiate  steps with > or = to 30% reduced Rt LE pain Baseline: 40-50% 05/02/24 Goal status: MET  4.  Verbalize and demonstrate body mechanics modifications for lumbar protection with daily tasks  Baseline:  Goal status: INITIAL   LONG TERM GOALS: Target date: 06/07/2024    Be independent in advanced HEP Baseline:  Goal status: INITIAL  2.  Report > or = to 75% fewer sleep interruptions due to LBP/Rt LE pain Baseline: 50% (05/02/24) Goal status: in progress   3.  Improve Modified Oswestry to < or = to 10% disability  Baseline: 22% Goal status: INITIAL  4.  Negotiate steps with > or = to 70% reduced Rt LE pain Baseline:  Goal status: INITIAL  PLAN:  PT FREQUENCY: 2x/week  PT DURATION: 8 weeks  PLANNED INTERVENTIONS: 97110-Therapeutic exercises, 97530- Therapeutic activity, [id number]- Neuromuscular re-education, 97535- Self Care, [id number]- Manual therapy, [phone]- Gait training, [phone]- Canalith repositioning, [id number]- Aquatic Therapy, [phone]- Electrical stimulation (unattended), [phone]- Electrical stimulation (manual), Patient/Family education, Taping, Dry Needling, Joint mobilization, Joint manipulation, Spinal manipulation, Spinal mobilization, Cryotherapy, and Moist heat.  PLAN FOR NEXT SESSION: assess response DN, Rt hip and gluteal strength, core stability, hip and lumbar flexibility.    Luella Sager, PT 05/02/24 11:02 AM  [facility] [address], [address] [place], [place] [address] Phone # [phone] Fax [phone]

## 2024-05-03 ENCOUNTER — Ambulatory Visit

## 2024-05-03 DIAGNOSIS — M5459 Other low back pain: Secondary | ICD-10-CM | POA: Diagnosis not present

## 2024-05-03 DIAGNOSIS — R262 Difficulty in walking, not elsewhere classified: Secondary | ICD-10-CM

## 2024-05-03 DIAGNOSIS — M6281 Muscle weakness (generalized): Secondary | ICD-10-CM

## 2024-05-03 DIAGNOSIS — M79604 Pain in right leg: Secondary | ICD-10-CM

## 2024-05-03 DIAGNOSIS — R252 Cramp and spasm: Secondary | ICD-10-CM

## 2024-05-03 NOTE — Therapy (Signed)
 OUTPATIENT PHYSICAL THERAPY TREATMENT   Patient Name: Andrea Mccann MRN: [id number] DOB:04-23-1951, 73 y.o., female Today's Date: 05/03/2024  END OF SESSION:  PT End of Session - 05/03/24 1231     Visit Number 7    Date for PT Re-Evaluation 06/07/24    Authorization Type [employer]    Progress Note Due on Visit 10    PT Start Time 1147    PT Stop Time 1228    PT Time Calculation (min) 41 min    Activity Tolerance Patient tolerated treatment well    Behavior During Therapy WFL for tasks assessed/performed                  Past Medical History:  Diagnosis Date   TMJ disease    History reviewed. No pertinent surgical history. Patient Active Problem List   Diagnosis Date Noted   IRRITABLE BOWEL SYNDROME 04/09/2009   UTI 04/09/2009   ABDOMINAL PAIN, GENERALIZED 04/09/2009   HYPERLIPIDEMIA 04/08/2009   Essential hypertension 04/08/2009   GERD 04/08/2009   CHEST PAIN 04/08/2009    PCP: Yvonnie Heritage, FNP   REFERRING PROVIDER: Glennon Lao   REFERRING DIAG:  Diagnosis  M54.16 (ICD-10-CM) - Radiculopathy, lumbar region    Rationale for Evaluation and Treatment: Rehabilitation  THERAPY DIAG:  Cramp and spasm  Pain in right leg  Other low back pain  Muscle weakness (generalized)  Difficulty in walking, not elsewhere classified  ONSET DATE: off and on/chronic- ~6 months ago  SUBJECTIVE:                                                                                                                                                                                           SUBJECTIVE STATEMENT: I was sore after dry needling.  No pain now, just stiffness.   From MD visit on 03/28/24:  73 y.o. 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 These symptoms are worsened with prolonged walking consistent with  neurogenic claudication. She has found relief with muscle relaxants and NSAIDs, as well as heat. I recommended the addition of a dedicated course of PT to which she is agreeable. We also discussed the role of interventional procedures such as an L4/5 TFESI. For now she wishes to hold off on injection therapy, hopeful symptoms will continue to improve with the addition of PT.  PERTINENT HISTORY:  Rt mastectomy, lymphedema  PAIN: 05/02/24 Are you having pain? Yes: NPRS scale: 4/10 Pain location: low back and Rt LE Pain description: achy Aggravating factors:sitting  on a hard surface, morning hours, sleep at night, going up steps Relieving factors: ice after exercise  PRECAUTIONS: None  RED FLAGS: None   WEIGHT BEARING RESTRICTIONS: No  FALLS:  Has patient fallen in last 6 months? 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  DIAGNOSTIC FINDINGS:  Lumbar MRI  IMPRESSION:  1. Multilevel facet arthropathy and disc bulging most significant at L4-5 causing moderate central canal stenosis.  2. Additional mild central canal stenosis is present at the levels of L3-4 and L5-S1.   PATIENT SURVEYS:  04/12/24: Modified Oswestry 11/50=22% disability    COGNITION: Overall cognitive status: Within functional limits for tasks assessed     SENSATION: WFL  MUSCLE LENGTH: Hamstrings limited by 50% bil with pain on Rt  POSTURE: rounded shoulders, forward head, and flexed trunk   PALPATION: Tension in bil lumbar paraspinals and bil gluteals, no pain reported with palpation   LUMBAR ROM:   Full lumbar A/ROM  without increased pain.  No directional preference for flexion/extension with repetition  LOWER EXTREMITY ROM:     Bil hamstrings and IR limited by 25-50% bilaterally   LOWER EXTREMITY MMT:    MMT Right eval Left eval  Hip flexion 4- 4+  Hip extension 4+ 4+  Hip abduction 4+ 4+  Hip adduction    Hip internal rotation    Hip external rotation    Knee flexion 5 5  Knee extension 5 5  Ankle dorsiflexion    Ankle plantarflexion    Ankle inversion    Ankle eversion     (Blank rows = not tested)  LUMBAR SPECIAL TESTS:  Slump test: Negative   GAIT: Distance walked: 50 Assistive device utilized: None Level of assistance: Complete Independence Comments: WNL  TREATMENT DATE:   05/02/24 NuStep: Level 5x 8 minutes-PT present to discuss progress  Seated piriformis stretch 3x20 seconds  TA contraction with ball squeeze 5" hold x 10 TA contraction with sequential march 2x10 Sit to stand: 5# kettlebell: neutral stance and staggered stance x10 each  Bridge with green band around thighs 2x10 Prone hip extension 2x10 Sidelying clam x10 bil each Standing on balance pad: around the [place] with 5# kettlebell x10 each   05/02/24 NuStep: Level 5x 8 minutes-PT present to discuss progress   TA contraction in hooklying - used tactile and verbal cues to instruct TA contraction with ball squeeze 5" hold x 10 TA contraction with sequential march 2x10  Trigger Point Dry Needling  Subsequent Treatment: Instructions provided previously at initial dry needling treatment.   Patient Verbal Consent Given: Yes Education Handout Provided: Previously Provided Muscles Treated: Rt gluteals, piriformis and lumbar multifidi Electrical Stimulation Performed: No Treatment Response/Outcome: Utilized skilled palpation to identify bony landmarks and trigger points.  Able to illicit twitch response and muscle elongation.  Soft tissue mobilization to R gluteals following DN to further promote tissue  elongation and decreased pain.      04/26/24 Prone lying x 1 min no change Prone on elbows x 1 min x 2 reps abolishes leg pain TA contraction in hooklying -  TA contraction with sequential march still too challenging TA contraction with single march  x 10 B TA contraction with SLR x 10 B Bridge with alternating knee ext x 10 ea Supine glute stretch x 30 sec Pelvis assessed - ant R innominate MET performed with dowel 5 sec hold x 3 not fully corrected MET performed with R foot on PT shoulder and Leg off EOB 3 sec x 3 Educated on anatomy and MET   PATIENT EDUCATION:  Education details: Access Code: [email or web address], dry needling info (04/17/24) Person educated: Patient Education method: Explanation, Demonstration, and Handouts Education comprehension: verbalized understanding and returned demonstration  HOME EXERCISE PROGRAM: Access Code: [email or web address] URL: [email or web address] Date: 04/26/2024 Prepared by: Concha Deed  Exercises - Active Straight Leg Raise with Quad Set  - 1 x daily - 7 x weekly - 1-2 sets - 10 reps - Seated Sciatic Tensioner  - 5 x daily - 7 x weekly - 1 sets - 10 reps - Seated Figure 4 Piriformis Stretch  - 3 x daily - 7 x weekly - 1 sets - 3 reps - 20 hold - Seated Hamstring Stretch  - 3 x daily - 7 x weekly - 1 sets - 3 reps - 20 hold - Seated Transversus Abdominis Bracing  - 1 x daily - 7 x weekly - 3 sets - 10 reps - Clamshell  - 2 x daily - 3 x weekly - 3 sets - 10 reps - Sit to Stand Without Arm Support  - 2 x daily - 7 x weekly - 2 sets - 10 reps - Supine Lower Trunk Rotation  - 3 x daily - 7 x weekly - 1 sets - 3 reps - 20 hold - Quadricep Stretch with Chair and Counter Support  - 1 x daily - 7 x weekly - 1 sets - 3 reps - 30 sec hold - Lying Prone  - 1 x daily - 7 x weekly - 1 sets - 1 reps - 2 min hold - Static Prone on Elbows  - 1 x daily - 7 x weekly - 1 sets - 1 reps - 2 min hold - 90/90 SI Joint Self-Correction with Dowel  - 1-2 x daily - 7 x weekly -  1 sets - 3 reps - 5 sec hold  ASSESSMENT:  CLINICAL IMPRESSION: No significant change in symptoms since yesterday.  She has had more good days overall and reports 40-50% reduction in symptoms overall.  She is independent and compliant with HEP. Sleep is 50% improved since the start of care.  Pt is doing well with form and is challenged by current level of activity. PT monitored throughout session for technique and pain. Patient will benefit from skilled PT to address the below impairments and improve overall function.     OBJECTIVE IMPAIRMENTS: decreased activity tolerance, decreased mobility, decreased strength, increased muscle spasms, impaired flexibility, improper body mechanics, postural dysfunction, and pain.   ACTIVITY LIMITATIONS: carrying, lifting, standing, sleeping, stairs, bed mobility, and locomotion level  PARTICIPATION LIMITATIONS: cleaning, laundry, and community activity  PERSONAL FACTORS: Time since onset of injury/illness/exacerbation and 1 comorbidity: lumbar DDD/stenosis  are also affecting patient's functional outcome.   REHAB POTENTIAL: Good  CLINICAL DECISION MAKING: Stable/uncomplicated  EVALUATION COMPLEXITY: Low   GOALS: Goals reviewed with patient? Yes  SHORT TERM GOALS: Target date: 05/10/2024    Be independent in initial HEP Baseline: Goal status: MET  2.  Report > or = to 25% fewer sleep interruptions due to LBP/Rt LE pain Baseline: 50% (05/02/24) Goal status: MET  3.  Negotiate steps with > or =  to 30% reduced Rt LE pain Baseline: 40-50% 05/02/24 Goal status: MET  4.  Verbalize and demonstrate body mechanics modifications for lumbar protection with daily tasks  Baseline:  Goal status: INITIAL   LONG TERM GOALS: Target date: 06/07/2024    Be independent in advanced HEP Baseline:  Goal status: INITIAL  2.  Report > or = to 75% fewer sleep interruptions due to LBP/Rt LE pain Baseline: 50% (05/02/24) Goal status: in progress   3.   Improve Modified Oswestry to < or = to 10% disability  Baseline: 22% Goal status: INITIAL  4.  Negotiate steps with > or = to 70% reduced Rt LE pain Baseline: 40-50% (05/03/24) Goal status: In progress  PLAN:  PT FREQUENCY: 2x/week  PT DURATION: 8 weeks  PLANNED INTERVENTIONS: 97110-Therapeutic exercises, 97530- Therapeutic activity, [id number]- Neuromuscular re-education, 97535- Self Care, [id number]- Manual therapy, [phone]- Gait training, [phone]- Canalith repositioning, [id number]- Aquatic Therapy, [phone]- Electrical stimulation (unattended), [phone]- Electrical stimulation (manual), Patient/Family education, Taping, Dry Needling, Joint mobilization, Joint manipulation, Spinal manipulation, Spinal mobilization, Cryotherapy, and Moist heat.  PLAN FOR NEXT SESSION: dry needling again, Rt hip and gluteal strength, core stability, hip and lumbar flexibility.    Luella Sager, PT 05/03/24 12:33 PM  [facility] [address] [place], [place] [address] Phone # [phone] Fax [phone]

## 2024-05-05 ENCOUNTER — Ambulatory Visit: Admitting: Physical Therapy

## 2024-05-08 ENCOUNTER — Ambulatory Visit

## 2024-05-08 DIAGNOSIS — M5459 Other low back pain: Secondary | ICD-10-CM

## 2024-05-08 DIAGNOSIS — M79604 Pain in right leg: Secondary | ICD-10-CM

## 2024-05-08 DIAGNOSIS — M6281 Muscle weakness (generalized): Secondary | ICD-10-CM

## 2024-05-08 DIAGNOSIS — R262 Difficulty in walking, not elsewhere classified: Secondary | ICD-10-CM

## 2024-05-08 DIAGNOSIS — R252 Cramp and spasm: Secondary | ICD-10-CM

## 2024-05-08 NOTE — Therapy (Signed)
 OUTPATIENT PHYSICAL THERAPY TREATMENT   Patient Name: Andrea Mccann MRN: [id number] DOB:08/15/1951, 73 y.o., female Today's Date: 05/08/2024  END OF SESSION:  PT End of Session - 05/08/24 1227     Visit Number 8    Date for PT Re-Evaluation 06/07/24    Authorization Type [employer]    Progress Note Due on Visit 10    PT Start Time 1147    PT Stop Time 1229    PT Time Calculation (min) 42 min    Activity Tolerance Patient tolerated treatment well    Behavior During Therapy WFL for tasks assessed/performed                   Past Medical History:  Diagnosis Date   TMJ disease    History reviewed. No pertinent surgical history. Patient Active Problem List   Diagnosis Date Noted   IRRITABLE BOWEL SYNDROME 04/09/2009   UTI 04/09/2009   ABDOMINAL PAIN, GENERALIZED 04/09/2009   HYPERLIPIDEMIA 04/08/2009   Essential hypertension 04/08/2009   GERD 04/08/2009   CHEST PAIN 04/08/2009    PCP: Yvonnie Heritage, FNP   REFERRING PROVIDER: Glennon Lao   REFERRING DIAG:  Diagnosis  M54.16 (ICD-10-CM) - Radiculopathy, lumbar region    Rationale for Evaluation and Treatment: Rehabilitation  THERAPY DIAG:  Cramp and spasm  Pain in right leg  Other low back pain  Muscle weakness (generalized)  Difficulty in walking, not elsewhere classified  ONSET DATE: off and on/chronic- ~6 months ago  SUBJECTIVE:                                                                                                                                                                                           SUBJECTIVE STATEMENT: Soreness in Rt leg and side of the back.  I walked 50 minutes and no increased pain.  Most of my pain is at night. Driving to [place]  in a week.    From MD visit on 03/28/24:  73 y.o. female presenting for Neurosurgical evaluation found to have L4/5 spinal stenosis during work-up for a several month exacerbation in LBP and associated radiating right hip  and RLE pain. These symptoms are worsened with prolonged walking consistent with neurogenic claudication. She has found relief with muscle relaxants and NSAIDs, as well as heat. I recommended the addition of a dedicated course of PT to which she is agreeable. We also discussed the role of interventional procedures such as an L4/5 TFESI. For now she wishes to hold off on injection therapy, hopeful symptoms will continue to improve with the addition of PT.  PERTINENT HISTORY:  Rt mastectomy, lymphedema  PAIN: 05/08/24 Are you having pain? Yes: NPRS scale: 3/10 (soreness) Pain location: low back and Rt LE Pain description: achy Aggravating factors:sitting on a hard surface, morning hours, sleep at night, going up steps Relieving factors: ice after exercise  PRECAUTIONS: None  RED FLAGS: None   WEIGHT BEARING RESTRICTIONS: No  FALLS:  Has patient fallen in last 6 months? 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  DIAGNOSTIC FINDINGS:  Lumbar MRI  IMPRESSION:  1. Multilevel facet arthropathy and disc bulging most significant at L4-5 causing moderate central canal stenosis.  2. Additional mild central canal stenosis is present at the levels of L3-4 and L5-S1.   PATIENT SURVEYS:  04/12/24: Modified Oswestry 11/50=22% disability    COGNITION: Overall cognitive status: Within functional limits for tasks assessed     SENSATION: WFL  MUSCLE LENGTH: Hamstrings limited by 50% bil with pain on Rt  POSTURE: rounded shoulders, forward head, and flexed trunk   PALPATION: Tension in bil lumbar paraspinals  and bil gluteals, no pain reported with palpation   LUMBAR ROM:   Full lumbar A/ROM without increased pain.  No directional preference for flexion/extension with repetition  LOWER EXTREMITY ROM:     Bil hamstrings and IR limited by 25-50% bilaterally   LOWER EXTREMITY MMT:    MMT Right eval Left eval  Hip flexion 4- 4+  Hip extension 4+ 4+  Hip abduction 4+ 4+  Hip adduction    Hip internal rotation    Hip external rotation    Knee flexion 5 5  Knee extension 5 5  Ankle dorsiflexion    Ankle plantarflexion    Ankle inversion    Ankle eversion     (Blank rows = not tested)  LUMBAR SPECIAL TESTS:  Slump test: Negative   GAIT: Distance walked: 50 Assistive device utilized: None Level of assistance: Complete Independence Comments: WNL  TREATMENT DATE:   05/08/24 NuStep: Level 5x 8 minutes-PT present to discuss progress  Seated piriformis stretch 3x20 seconds  TA contraction with isometric press: opp arm/leg 5" hold x 10 each  Sit to stand: 5# kettlebell: neutral stance and staggered stance x10 each  Bridge with green band around thighs 2x10 Prone hip extension 2x10 [employer]: 5# kettlebell x1 lap each Standing on balance pad: around the world with 5# kettlebell x10 each  05/02/24 NuStep: Level 5x 8 minutes-PT present to discuss progress  Seated piriformis stretch 3x20 seconds  TA contraction with ball squeeze 5" hold x 10 TA contraction with sequential march 2x10 Sit to stand: 5# kettlebell: neutral stance and staggered stance x10 each  Bridge with green band around thighs 2x10 Prone hip extension 2x10 Sidelying clam x10 bil each Standing on balance pad: around the [place] with 5# kettlebell x10 each   05/02/24 NuStep: Level 5x 8 minutes-PT present to discuss progress   TA contraction in hooklying - used tactile and verbal cues to instruct TA contraction with ball squeeze 5" hold x 10 TA contraction with sequential march 2x10  Trigger Point Dry  Needling  Subsequent Treatment: Instructions provided previously at initial dry needling treatment.   Patient Verbal Consent Given: Yes Education Handout  Provided: Previously Provided Muscles Treated: Rt gluteals, piriformis and lumbar multifidi Electrical Stimulation Performed: No Treatment Response/Outcome: Utilized skilled palpation to identify bony landmarks and trigger points.  Able to illicit twitch response and muscle elongation.  Soft tissue mobilization to R gluteals following DN to further promote tissue elongation and decreased pain.      PATIENT EDUCATION:  Education details: Access Code: [email or web address], dry needling info (04/17/24) Person educated: Patient Education method: Explanation, Demonstration, and Handouts Education comprehension: verbalized understanding and returned demonstration  HOME EXERCISE PROGRAM: Access Code: [email or web address] URL: [email or web address] Date: 04/26/2024 Prepared by: Concha Deed  Exercises - Active Straight Leg Raise with Quad Set  - 1 x daily - 7 x weekly - 1-2 sets - 10 reps - Seated Sciatic Tensioner  - 5 x daily - 7 x weekly - 1 sets - 10 reps - Seated Figure 4 Piriformis Stretch  - 3 x daily - 7 x weekly - 1 sets - 3 reps - 20 hold - Seated Hamstring Stretch  - 3 x daily - 7 x weekly - 1 sets - 3 reps - 20 hold - Seated Transversus Abdominis Bracing  - 1 x daily - 7 x weekly - 3 sets - 10 reps - Clamshell  - 2 x daily - 3 x weekly - 3 sets - 10 reps - Sit to Stand Without Arm Support  - 2 x daily - 7 x weekly - 2 sets - 10 reps - Supine Lower Trunk Rotation  - 3 x daily - 7 x weekly - 1 sets - 3 reps - 20 hold - Quadricep Stretch with Chair and Counter Support  - 1 x daily - 7 x weekly - 1 sets - 3 reps - 30 sec hold - Lying Prone  - 1 x daily - 7 x weekly - 1 sets - 1 reps - 2 min hold - Static Prone on Elbows  - 1 x daily - 7 x weekly - 1 sets - 1 reps - 2 min hold - 90/90 SI Joint Self-Correction with Dowel  - 1-2 x daily - 7 x weekly -  1 sets - 3 reps - 5 sec hold  ASSESSMENT:  CLINICAL IMPRESSION: Soreness in Rt leg and low back.  She has had more good days overall and reports 40-50% reduction in symptoms overall.  She is independent and compliant with HEP. PT discussed how to take breaks during long drive to [place]  and modify exercises while traveling.   Pt is doing well with form and is challenged by current level of activity. PT monitored throughout session for technique and pain. Patient will benefit from skilled PT to address the below impairments and improve overall function.     OBJECTIVE IMPAIRMENTS: decreased activity tolerance, decreased mobility, decreased strength, increased muscle spasms, impaired flexibility, improper body mechanics, postural dysfunction, and pain.   ACTIVITY LIMITATIONS: carrying, lifting, standing, sleeping, stairs, bed mobility, and locomotion level  PARTICIPATION LIMITATIONS: cleaning, laundry, and community activity  PERSONAL FACTORS: Time since onset of injury/illness/exacerbation and 1 comorbidity: lumbar DDD/stenosis  are also affecting patient's functional outcome.   REHAB POTENTIAL: Good  CLINICAL DECISION MAKING: Stable/uncomplicated  EVALUATION COMPLEXITY: Low   GOALS: Goals reviewed with patient? Yes  SHORT TERM GOALS: Target date: 05/10/2024    Be independent in initial HEP Baseline: Goal status: MET  2.  Report > or = to 25% fewer sleep interruptions due to LBP/Rt LE pain Baseline: 50% (05/02/24) Goal status: MET  3.  Negotiate steps with >  or = to 30% reduced Rt LE pain Baseline: 40-50% 05/02/24 Goal status: MET  4.  Verbalize and demonstrate body mechanics modifications for lumbar protection with daily tasks  Baseline:  Goal status: INITIAL   LONG TERM GOALS: Target date: 06/07/2024    Be independent in advanced HEP Baseline:  Goal status: INITIAL  2.  Report > or = to 75% fewer sleep interruptions due to LBP/Rt LE pain Baseline: 50% (05/02/24) Goal  status: in progress   3.  Improve Modified Oswestry to < or = to 10% disability  Baseline: 22% Goal status: INITIAL  4.  Negotiate steps with > or = to 70% reduced Rt LE pain Baseline: 40-50% (05/03/24) Goal status: In progress  PLAN:  PT FREQUENCY: 2x/week  PT DURATION: 8 weeks  PLANNED INTERVENTIONS: 97110-Therapeutic exercises, 97530- Therapeutic activity, [id number]- Neuromuscular re-education, 97535- Self Care, [id number]- Manual therapy, [phone]- Gait training, [phone]- Canalith repositioning, [id number]- Aquatic Therapy, [phone]- Electrical stimulation (unattended), [phone]- Electrical stimulation (manual), Patient/Family education, Taping, Dry Needling, Joint mobilization, Joint manipulation, Spinal manipulation, Spinal mobilization, Cryotherapy, and Moist heat.  PLAN FOR NEXT SESSION: dry needling again, Rt hip and gluteal strength, core stability, hip and lumbar flexibility. Give ODI   Luella Sager, PT 05/08/24 12:30 PM  [facility] [address] [place], [place] [address] Phone # [phone] Fax [phone]

## 2024-05-11 ENCOUNTER — Ambulatory Visit

## 2024-05-11 DIAGNOSIS — R262 Difficulty in walking, not elsewhere classified: Secondary | ICD-10-CM

## 2024-05-11 DIAGNOSIS — M6281 Muscle weakness (generalized): Secondary | ICD-10-CM

## 2024-05-11 DIAGNOSIS — M5459 Other low back pain: Secondary | ICD-10-CM

## 2024-05-11 DIAGNOSIS — R252 Cramp and spasm: Secondary | ICD-10-CM

## 2024-05-11 DIAGNOSIS — M79604 Pain in right leg: Secondary | ICD-10-CM

## 2024-05-11 NOTE — Therapy (Signed)
 OUTPATIENT PHYSICAL THERAPY TREATMENT   Patient Name: Andrea Mccann MRN: [id number] DOB:August 03, 1951, 73 y.o., female Today's Date: 05/11/2024  END OF SESSION:  PT End of Session - 05/11/24 1233     Visit Number 9    Date for PT Re-Evaluation 06/07/24    Authorization Type [employer]    Progress Note Due on Visit 10    PT Start Time 1148    PT Stop Time 1229    PT Time Calculation (min) 41 min    Activity Tolerance Patient tolerated treatment well    Behavior During Therapy WFL for tasks assessed/performed                    Past Medical History:  Diagnosis Date   TMJ disease    History reviewed. No pertinent surgical history. Patient Active Problem List   Diagnosis Date Noted   IRRITABLE BOWEL SYNDROME 04/09/2009   UTI 04/09/2009   ABDOMINAL PAIN, GENERALIZED 04/09/2009   HYPERLIPIDEMIA 04/08/2009   Essential hypertension 04/08/2009   GERD 04/08/2009   CHEST PAIN 04/08/2009    PCP: Yvonnie Heritage, FNP   REFERRING PROVIDER: Glennon Lao   REFERRING DIAG:  Diagnosis  M54.16 (ICD-10-CM) - Radiculopathy, lumbar region    Rationale for Evaluation and Treatment: Rehabilitation  THERAPY DIAG:  Cramp and spasm  Pain in right leg  Other low back pain  Muscle weakness (generalized)  Difficulty in walking, not elsewhere classified  ONSET DATE: off and on/chronic- ~6 months ago  SUBJECTIVE:                                                                                                                                                                                           SUBJECTIVE STATEMENT: Rt leg pain continues at night. I took the medicine that the MD gave me and it didn't help.  Leaving for a trip to [place] .  From MD visit on 03/28/24:  73 y.o. 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 These  symptoms are worsened with prolonged walking consistent with neurogenic claudication. She has found relief with muscle relaxants and NSAIDs, as well as heat. I recommended the addition of a dedicated course of PT to which she is agreeable. We also discussed the role of interventional procedures such as an L4/5 TFESI. For now she wishes to hold off on injection therapy, hopeful symptoms will continue to improve with the addition of PT.  PERTINENT HISTORY:  Rt mastectomy, lymphedema  PAIN: 05/08/24 Are you having pain? Yes:  NPRS scale: 3/10 (soreness) Pain location: low back and Rt LE Pain description: achy Aggravating factors:sitting on a hard surface, morning hours, sleep at night, going up steps Relieving factors: ice after exercise  PRECAUTIONS: None  RED FLAGS: None   WEIGHT BEARING RESTRICTIONS: No  FALLS:  Has patient fallen in last 6 months? 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  DIAGNOSTIC FINDINGS:  Lumbar MRI  IMPRESSION:  1. Multilevel facet arthropathy and disc bulging most significant at L4-5 causing moderate central canal stenosis.  2. Additional mild central canal stenosis is present at the levels of L3-4 and L5-S1.   PATIENT SURVEYS:  04/12/24: Modified Oswestry 11/50=22% disability    COGNITION: Overall cognitive status: Within functional limits for tasks assessed     SENSATION: WFL  MUSCLE LENGTH: Hamstrings limited by 50% bil with pain on Rt  POSTURE: rounded shoulders, forward head, and flexed trunk   PALPATION: Tension in bil lumbar paraspinals and bil gluteals, no  pain reported with palpation   LUMBAR ROM:   Full lumbar A/ROM without increased pain.  No directional preference for flexion/extension with repetition  LOWER EXTREMITY ROM:     Bil hamstrings and IR limited by 25-50% bilaterally   LOWER EXTREMITY MMT:    MMT Right eval Left eval  Hip flexion 4- 4+  Hip extension 4+ 4+  Hip abduction 4+ 4+  Hip adduction    Hip internal rotation    Hip external rotation    Knee flexion 5 5  Knee extension 5 5  Ankle dorsiflexion    Ankle plantarflexion    Ankle inversion    Ankle eversion     (Blank rows = not tested)  LUMBAR SPECIAL TESTS:  Slump test: Negative   GAIT: Distance walked: 50 Assistive device utilized: None Level of assistance: Complete Independence Comments: WNL  TREATMENT DATE:   05/11/24 NuStep: Level 5x 8 minutes-PT present to discuss progress  Seated piriformis stretch 3x20 seconds  Seated hamstring stretch 3x20 seconds bil  Sit to stand: 5# kettlebell: neutral stance and staggered stance x10 each  Farmer's Carry: 10# kettlebell x1 lap each Standing on balance pad: around the world with 5# kettlebell x10 each Trigger Point Dry Needling  Subsequent Treatment: Instructions provided previously at initial dry needling treatment.   Patient Verbal Consent Given: Yes Education Handout Provided: Previously Provided Muscles Treated: Rt gluteals, piriformis and lumbar multifidi Electrical Stimulation Performed: No Treatment Response/Outcome: Utilized skilled palpation to identify bony landmarks and trigger points.  Able to illicit twitch response and muscle elongation.  Soft tissue mobilization to R gluteals following DN to further promote tissue elongation and decreased pain.     05/08/24 NuStep: Level 5x 8 minutes-PT present to discuss progress  Seated piriformis stretch 3x20 seconds  TA contraction with isometric press: opp arm/leg 5" hold x 10 each  Sit to stand: 5# kettlebell: neutral stance and staggered  stance x10 each  Bridge with green band around thighs 2x10 Prone hip extension 2x10 Farmer's Carry: 5# kettlebell x1 lap each Standing on balance pad: around the world with 5# kettlebell x10 each  05/02/24 NuStep: Level 5x 8 minutes-PT present to discuss  progress  Seated piriformis stretch 3x20 seconds  TA contraction with ball squeeze 5" hold x 10 TA contraction with sequential march 2x10 Sit to stand: 5# kettlebell: neutral stance and staggered stance x10 each  Bridge with green band around thighs 2x10 Prone hip extension 2x10 Sidelying clam x10 bil each Standing on balance pad: around the world with 5# kettlebell x10 each   PATIENT EDUCATION:  Education details: Access Code: [email or web address], dry needling info (04/17/24) Person educated: Patient Education method: Explanation, Demonstration, and Handouts Education comprehension: verbalized understanding and returned demonstration  HOME EXERCISE PROGRAM: Access Code: [email or web address] URL: [email or web address] Date: 04/26/2024 Prepared by: Concha Deed  Exercises - Active Straight Leg Raise with Quad Set  - 1 x daily - 7 x weekly - 1-2 sets - 10 reps - Seated Sciatic Tensioner  - 5 x daily - 7 x weekly - 1 sets - 10 reps - Seated Figure 4 Piriformis Stretch  - 3 x daily - 7 x weekly - 1 sets - 3 reps - 20 hold - Seated Hamstring Stretch  - 3 x daily - 7 x weekly - 1 sets - 3 reps - 20 hold - Seated Transversus Abdominis Bracing  - 1 x daily - 7 x weekly - 3 sets - 10 reps - Clamshell  - 2 x daily - 3 x weekly - 3 sets - 10 reps - Sit to Stand Without Arm Support  - 2 x daily - 7 x weekly - 2 sets - 10 reps - Supine Lower Trunk Rotation  - 3 x daily - 7 x weekly - 1 sets - 3 reps - 20 hold - Quadricep Stretch with Chair and Counter Support  - 1 x daily - 7 x weekly - 1 sets - 3 reps - 30 sec hold - Lying Prone  - 1 x daily - 7 x weekly - 1 sets - 1 reps - 2 min hold - Static Prone on Elbows  - 1 x daily - 7 x weekly - 1 sets - 1 reps -  2 min hold - 90/90 SI Joint Self-Correction with Dowel  - 1-2 x daily - 7 x weekly - 1 sets - 3 reps - 5 sec hold  ASSESSMENT:  CLINICAL IMPRESSION: Rt leg pain at night last night.  Tried medication and this didn't help.  She has had more good days overall and reports 40-50% reduction in symptoms overall.  She is independent and compliant with HEP. PT discussed again how to take breaks during long drive to [place]  and modify exercises while traveling.   Good response to dry needling with improved tissue mobility and twitch response in Rt gluteals.   PT monitored throughout session for technique and pain. Patient will benefit from skilled PT to address the below impairments and improve overall function.     OBJECTIVE IMPAIRMENTS: decreased activity tolerance, decreased mobility, decreased strength, increased muscle spasms, impaired flexibility, improper body mechanics, postural dysfunction, and pain.   ACTIVITY LIMITATIONS: carrying, lifting, standing, sleeping, stairs, bed mobility, and locomotion level  PARTICIPATION LIMITATIONS: cleaning, laundry, and community activity  PERSONAL FACTORS: Time since onset of injury/illness/exacerbation and 1 comorbidity: lumbar DDD/stenosis  are also affecting patient's functional outcome.   REHAB POTENTIAL: Good  CLINICAL DECISION MAKING: Stable/uncomplicated  EVALUATION COMPLEXITY: Low   GOALS: Goals reviewed with patient? Yes  SHORT TERM GOALS: Target date: 05/10/2024    Be independent in initial HEP Baseline: Goal status: MET  2.  Report > or = to 25% fewer sleep  interruptions due to LBP/Rt LE pain Baseline: 50% (05/02/24) Goal status: MET  3.  Negotiate steps with > or = to 30% reduced Rt LE pain Baseline: 40-50% 05/02/24 Goal status: MET  4.  Verbalize and demonstrate body mechanics modifications for lumbar protection with daily tasks  Baseline:  Goal status: INITIAL   LONG TERM GOALS: Target date: 06/07/2024    Be independent in  advanced HEP Baseline:  Goal status: INITIAL  2.  Report > or = to 75% fewer sleep interruptions due to LBP/Rt LE pain Baseline: 50% (05/02/24) Goal status: in progress   3.  Improve Modified Oswestry to < or = to 10% disability  Baseline: 22% Goal status: INITIAL  4.  Negotiate steps with > or = to 70% reduced Rt LE pain Baseline: 40-50% (05/03/24) Goal status: In progress  PLAN:  PT FREQUENCY: 2x/week  PT DURATION: 8 weeks  PLANNED INTERVENTIONS: 97110-Therapeutic exercises, 97530- Therapeutic activity, [id number]- Neuromuscular re-education, 97535- Self Care, 16109- Manual therapy, [phone]- Gait training, [phone]- Canalith repositioning, [id number]- Aquatic Therapy, [phone]- Electrical stimulation (unattended), [phone]- Electrical stimulation (manual), Patient/Family education, Taping, Dry Needling, Joint mobilization, Joint manipulation, Spinal manipulation, Spinal mobilization, Cryotherapy, and Moist heat.  PLAN FOR NEXT SESSION: dry needling again, Rt hip and gluteal strength, core stability, hip and lumbar flexibility. Give Modified Oswestry- 10th visit next.  Pt will be out of town until early June   Luella Sager, [place] 05/11/24 12:49 PM  [facility] [address] [place], [place] [address] Phone # [phone] Fax [phone]

## 2024-05-29 ENCOUNTER — Ambulatory Visit: Attending: Neurosurgery

## 2024-05-29 DIAGNOSIS — M79604 Pain in right leg: Secondary | ICD-10-CM | POA: Insufficient documentation

## 2024-05-29 DIAGNOSIS — R252 Cramp and spasm: Secondary | ICD-10-CM | POA: Diagnosis present

## 2024-05-29 DIAGNOSIS — M6281 Muscle weakness (generalized): Secondary | ICD-10-CM | POA: Insufficient documentation

## 2024-05-29 DIAGNOSIS — M5459 Other low back pain: Secondary | ICD-10-CM | POA: Diagnosis present

## 2024-05-29 NOTE — Therapy (Signed)
 OUTPATIENT PHYSICAL THERAPY TREATMENT   Patient Name: Andrea Mccann MRN: [id number] DOB:March 03, 1951, 73 y.o., female Today's Date: 05/29/2024 Progress Note Reporting Period 04/12/24 to 05/29/24  See note below for Objective Data and Assessment of Progress/Goals.     END OF SESSION:  PT End of Session - 05/29/24 1228     Visit Number 10    Date for PT Re-Evaluation 06/07/24    Authorization Type [employer] Medicare    Progress Note Due on Visit 20    PT Start Time 1146    PT Stop Time 1228    PT Time Calculation (min) 42 min    Activity Tolerance Patient tolerated treatment well    Behavior During Therapy WFL for tasks assessed/performed                     Past Medical History:  Diagnosis Date   TMJ disease    History reviewed. No pertinent surgical history. Patient Active Problem List   Diagnosis Date Noted   IRRITABLE BOWEL SYNDROME 04/09/2009   UTI 04/09/2009   ABDOMINAL PAIN, GENERALIZED 04/09/2009   HYPERLIPIDEMIA 04/08/2009   Essential hypertension 04/08/2009   GERD 04/08/2009   CHEST PAIN 04/08/2009    PCP: Yvonnie Heritage, FNP   REFERRING PROVIDER: Glennon Lao   REFERRING DIAG:  Diagnosis  M54.16 (ICD-10-CM) - Radiculopathy, lumbar region    Rationale for Evaluation and Treatment: Rehabilitation  THERAPY DIAG:  Cramp and spasm  Pain in right leg  Other low back pain  Muscle weakness (generalized)  ONSET DATE: off and on/chronic- ~6 months ago  SUBJECTIVE:                                                                                                                                                                                           SUBJECTIVE STATEMENT: I was in [place] , did a lot of driving.  I didn't do as much walking as usual but I was staying consistent with my exercises.  My hip started hurting again when I got home, it might be because of the steps.    From MD visit on 03/28/24:  73 y.o. female presenting for  Neurosurgical evaluation found to have L4/5 spinal stenosis during work-up for a several month exacerbation in LBP and associated radiating right hip and RLE pain. These symptoms are worsened with prolonged walking consistent with neurogenic claudication. She has found relief with muscle relaxants and NSAIDs, as well as heat. I recommended the addition of a dedicated course of PT to which she is agreeable. We also discussed the role of interventional procedures such as an L4/5 TFESI. For now  she wishes to hold off on injection therapy, hopeful symptoms will continue to improve with the addition of PT.  PERTINENT HISTORY:  Rt mastectomy, lymphedema  PAIN: 05/29/24 Are you having pain? Yes: NPRS scale: 0/10 pain now, getting brief/sharp pain that is intermittent  Pain location: low back and Rt LE Pain description: achy Aggravating factors:sitting on a hard surface, morning hours, sleep at night, going up steps Relieving factors: ice after exercise  PRECAUTIONS: None  RED FLAGS: None   WEIGHT BEARING RESTRICTIONS: No  FALLS:  Has patient fallen in last 6 months? 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  DIAGNOSTIC FINDINGS:  Lumbar MRI  IMPRESSION:  1. Multilevel facet arthropathy and disc bulging most significant at L4-5 causing moderate central canal stenosis.  2. Additional mild central canal stenosis is present at the levels of L3-4 and L5-S1.   PATIENT SURVEYS:  04/12/24: Modified Oswestry 11/50=22% disability   05/29/24: 7/50=14% disability   COGNITION: Overall cognitive status: Within functional  limits for tasks assessed     SENSATION: WFL  MUSCLE LENGTH: Hamstrings limited by 50% bil with pain on Rt  POSTURE: rounded shoulders, forward head, and flexed trunk   PALPATION: Tension in bil lumbar paraspinals and bil gluteals, no pain reported with palpation   LUMBAR ROM:   Full lumbar A/ROM without increased pain.  No directional preference for flexion/extension with repetition  LOWER EXTREMITY ROM:     Bil hamstrings and IR limited by 25-50% bilaterally   LOWER EXTREMITY MMT:    MMT Right eval Left eval  Hip flexion 4- 4+  Hip extension 4+ 4+  Hip abduction 4+ 4+  Hip adduction    Hip internal rotation    Hip external rotation    Knee flexion 5 5  Knee extension 5 5  Ankle dorsiflexion    Ankle plantarflexion    Ankle inversion    Ankle eversion     (Blank rows = not tested)  LUMBAR SPECIAL TESTS:  Slump test: Negative   GAIT: Distance walked: 50 Assistive device utilized: None Level of assistance: Complete Independence Comments: WNL  TREATMENT DATE:   05/29/24 NuStep: Level 5x 8 minutes-PT present to discuss progress  Seated piriformis stretch 3x20 seconds  Seated hamstring stretch 3x20 seconds bil  Sit to stand: 5# kettlebell: neutral stance and staggered stance 2x10 each  Bridge with green band 2x10 Supine: TA activation with opp arm/leg push into purple ball Farmer's Carry: 10# kettlebell x1 lap each Standing on balance pad: around the world with 5# kettlebell x10 each   05/11/24 NuStep: Level 5x 8 minutes-PT present to discuss progress  Seated piriformis stretch 3x20 seconds  Seated hamstring stretch 3x20 seconds bil  Sit to stand: 5# kettlebell: neutral stance and staggered stance x10 each  Farmer's Carry: 10# kettlebell x1 lap each Standing on balance pad: around the world with 5# kettlebell x10 each Trigger Point Dry Needling  Subsequent Treatment: Instructions provided previously at initial dry needling treatment.   Patient Verbal  Consent Given: Yes Education Handout Provided: Previously Provided Muscles Treated: Rt gluteals, piriformis and lumbar multifidi Electrical Stimulation Performed: No Treatment Response/Outcome: Utilized skilled palpation to  identify bony landmarks and trigger points.  Able to illicit twitch response and muscle elongation.  Soft tissue mobilization to R gluteals following DN to further promote tissue elongation and decreased pain.     05/08/24 NuStep: Level 5x 8 minutes-PT present to discuss progress  Seated piriformis stretch 3x20 seconds  TA contraction with isometric press: opp arm/leg 5" hold x 10 each  Sit to stand: 5# kettlebell: neutral stance and staggered stance x10 each  Bridge with green band around thighs 2x10 Prone hip extension 2x10 Farmer's Carry: 5# kettlebell x1 lap each Standing on balance pad: around the world with 5# kettlebell x10 each   PATIENT EDUCATION:  Education details: Access Code: [email or web address], dry needling info (04/17/24) Person educated: Patient Education method: Explanation, Demonstration, and Handouts Education comprehension: verbalized understanding and returned demonstration  HOME EXERCISE PROGRAM: Access Code: [email or web address] URL: [email or web address] Date: 04/26/2024 Prepared by: Concha Deed  Exercises - Active Straight Leg Raise with Quad Set  - 1 x daily - 7 x weekly - 1-2 sets - 10 reps - Seated Sciatic Tensioner  - 5 x daily - 7 x weekly - 1 sets - 10 reps - Seated Figure 4 Piriformis Stretch  - 3 x daily - 7 x weekly - 1 sets - 3 reps - 20 hold - Seated Hamstring Stretch  - 3 x daily - 7 x weekly - 1 sets - 3 reps - 20 hold - Seated Transversus Abdominis Bracing  - 1 x daily - 7 x weekly - 3 sets - 10 reps - Clamshell  - 2 x daily - 3 x weekly - 3 sets - 10 reps - Sit to Stand Without Arm Support  - 2 x daily - 7 x weekly - 2 sets - 10 reps - Supine Lower Trunk Rotation  - 3 x daily - 7 x weekly - 1 sets - 3 reps - 20 hold - Quadricep Stretch  with Chair and Counter Support  - 1 x daily - 7 x weekly - 1 sets - 3 reps - 30 sec hold - Lying Prone  - 1 x daily - 7 x weekly - 1 sets - 1 reps - 2 min hold - Static Prone on Elbows  - 1 x daily - 7 x weekly - 1 sets - 1 reps - 2 min hold - 90/90 SI Joint Self-Correction with Dowel  - 1-2 x daily - 7 x weekly - 1 sets - 3 reps - 5 sec hold  ASSESSMENT:  CLINICAL IMPRESSION: Pt was traveling so lapse in treatment.  She remained consistent with her HEP and reports that she wasn't able to walk as much as usual.  She began to have more Rt leg pain when she returned home and isn't sure of the cause.  PT advised pt to try to stretch more frequently now that she is resuming her usual activity.  She is no longer having pain that limits her sleep.  Modified Oswestry is improved from 22% to 14% disability. PT monitored throughout session for technique and pain. Patient will benefit from skilled PT to address the below impairments and improve overall function.     OBJECTIVE IMPAIRMENTS: decreased activity tolerance, decreased mobility, decreased strength, increased muscle spasms, impaired flexibility, improper body mechanics, postural dysfunction, and pain.   ACTIVITY LIMITATIONS: carrying, lifting, standing, sleeping, stairs, bed mobility, and locomotion level  PARTICIPATION LIMITATIONS: cleaning, laundry, and community activity  PERSONAL FACTORS: Time since onset of injury/illness/exacerbation and 1 comorbidity: lumbar DDD/stenosis  are also  affecting patient's functional outcome.   REHAB POTENTIAL: Good  CLINICAL DECISION MAKING: Stable/uncomplicated  EVALUATION COMPLEXITY: Low   GOALS: Goals reviewed with patient? Yes  SHORT TERM GOALS: Target date: 05/10/2024    Be independent in initial HEP Baseline: Goal status: MET  2.  Report > or = to 25% fewer sleep interruptions due to LBP/Rt LE pain Baseline: 50% (05/02/24) Goal status: MET  3.  Negotiate steps with > or = to 30% reduced Rt  LE pain Baseline: 40-50% 05/02/24 Goal status: MET  4.  Verbalize and demonstrate body mechanics modifications for lumbar protection with daily tasks  Baseline:  Goal status: INITIAL   LONG TERM GOALS: Target date: 06/07/2024    Be independent in advanced HEP Baseline:  Goal status: INITIAL  2.  Report > or = to 75% fewer sleep interruptions due to LBP/Rt LE pain Baseline: no sleep interruptions (05/29/24) Goal status: MET  3.  Improve Modified Oswestry to < or = to 10% disability  Baseline: 14% disability (05/29/24) Goal status:in progress   4.  Negotiate steps with > or = to 70% reduced Rt LE pain Baseline: 40-50% (05/03/24) Goal status: In progress  PLAN:  PT FREQUENCY: 2x/week  PT DURATION: 8 weeks  PLANNED INTERVENTIONS: 97110-Therapeutic exercises, 97530- Therapeutic activity, [id number]- Neuromuscular re-education, 97535- Self Care, [id number]- Manual therapy, [id number]- Gait training, [phone]- Canalith repositioning, [id number]- Aquatic Therapy, [phone]- Electrical stimulation (unattended), [phone]- Electrical stimulation (manual), Patient/Family education, Taping, Dry Needling, Joint mobilization, Joint manipulation, Spinal manipulation, Spinal mobilization, Cryotherapy, and Moist heat.  PLAN FOR NEXT SESSION: dry needling again if needed, Rt hip and gluteal strength, core stability, hip and lumbar flexibility. ERO next  Luella Sager, PT 05/29/24 12:30 PM  [facility] [address], [address] [place], [place] [address] Phone # [phone] Fax [phone]

## 2024-06-01 ENCOUNTER — Ambulatory Visit

## 2024-06-01 DIAGNOSIS — M6281 Muscle weakness (generalized): Secondary | ICD-10-CM

## 2024-06-01 DIAGNOSIS — R252 Cramp and spasm: Secondary | ICD-10-CM | POA: Diagnosis not present

## 2024-06-01 DIAGNOSIS — M79604 Pain in right leg: Secondary | ICD-10-CM

## 2024-06-01 DIAGNOSIS — M5459 Other low back pain: Secondary | ICD-10-CM

## 2024-06-01 NOTE — Therapy (Signed)
 OUTPATIENT PHYSICAL THERAPY TREATMENT   Patient Name: Andrea Mccann MRN: [id number] DOB:Mar 24, 1951, 73 y.o., female Today's Date: 06/01/2024    END OF SESSION:  PT End of Session - 06/01/24 1231     Visit Number 11    Date for PT Re-Evaluation 06/07/24    Authorization Type [employer]    Progress Note Due on Visit 20    PT Start Time 1149    PT Stop Time 1228    PT Time Calculation (min) 39 min    Activity Tolerance Patient tolerated treatment well    Behavior During Therapy WFL for tasks assessed/performed                   Past Medical History:  Diagnosis Date   TMJ disease    History reviewed. No pertinent surgical history. Patient Active Problem List   Diagnosis Date Noted   IRRITABLE BOWEL SYNDROME 04/09/2009   UTI 04/09/2009   ABDOMINAL PAIN, GENERALIZED 04/09/2009   HYPERLIPIDEMIA 04/08/2009   Essential hypertension 04/08/2009   GERD 04/08/2009   CHEST PAIN 04/08/2009    PCP: Yvonnie Heritage, FNP   REFERRING PROVIDER: Glennon Lao   REFERRING DIAG:  Diagnosis  M54.16 (ICD-10-CM) - Radiculopathy, lumbar region    Rationale for Evaluation and Treatment: Rehabilitation  THERAPY DIAG:  Cramp and spasm - Plan: PT plan of care cert/re-cert  Pain in right leg - Plan: PT plan of care cert/re-cert  Other low back pain - Plan: PT plan of care cert/re-cert  Muscle weakness (generalized) - Plan: PT plan of care cert/re-cert  ONSET DATE: off and on/chronic- ~6 months ago  SUBJECTIVE:                                                                                                                                                                                           SUBJECTIVE STATEMENT: I am feeling better today.  I have been back walking more regularly.     From MD visit on 03/28/24:  73 y.o. 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 These symptoms are worsened with prolonged walking consistent with neurogenic claudication. She has found relief with muscle relaxants and NSAIDs, as well as heat. I recommended the addition of a dedicated course of PT to which she is agreeable. We also discussed the role of interventional procedures such as an L4/5 TFESI. For now she wishes to hold off on injection therapy, hopeful symptoms will continue to improve with the addition of PT.  PERTINENT HISTORY:  Rt mastectomy, lymphedema  PAIN: 05/29/24 Are you having pain? Yes: NPRS scale: 0/10 pain now, getting brief/sharp pain that is intermittent  Pain location: low back and Rt LE Pain description: achy Aggravating factors:sitting on a hard surface, morning hours, sleep at night, going up steps Relieving factors: ice after exercise  PRECAUTIONS: None  RED FLAGS: None   WEIGHT BEARING RESTRICTIONS: No  FALLS:  Has patient fallen in last 6 months? 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  DIAGNOSTIC FINDINGS:  Lumbar MRI  IMPRESSION:  1. Multilevel facet arthropathy and disc bulging most significant at L4-5 causing moderate central canal stenosis.  2. Additional mild central canal stenosis is present at the levels of L3-4 and L5-S1.   PATIENT SURVEYS:  04/12/24: Modified Oswestry 11/50=22% disability   05/29/24: 7/50=14% disability   COGNITION: Overall cognitive status: Within functional limits for tasks assessed     SENSATION: WFL  MUSCLE LENGTH: Hamstrings limited by 50% bil with pain on  Rt  POSTURE: rounded shoulders, forward head, and flexed trunk   PALPATION: Tension in bil lumbar paraspinals and bil gluteals, no pain reported with palpation   LUMBAR ROM:   Full lumbar A/ROM without increased pain.  No directional preference for flexion/extension with repetition  LOWER EXTREMITY ROM:     Bil hamstrings and IR limited by 25-50% bilaterally   LOWER EXTREMITY MMT:    MMT Right eval Left eval  Hip flexion 4- 4+  Hip extension 4+ 4+  Hip abduction 4+ 4+  Hip adduction    Hip internal rotation    Hip external rotation    Knee flexion 5 5  Knee extension 5 5  Ankle dorsiflexion    Ankle plantarflexion    Ankle inversion    Ankle eversion     (Blank rows = not tested)  LUMBAR SPECIAL TESTS:  Slump test: Negative   GAIT: Distance walked: 50 Assistive device utilized: None Level of assistance: Complete Independence Comments: WNL  TREATMENT DATE:   06/01/24 NuStep: Level 5x 8 minutes-PT present to discuss progress  Seated piriformis stretch 3x20 seconds  Seated hamstring stretch 3x20 seconds bil  Sit to stand: 5# kettlebell: neutral stance and staggered stance 2x10 each  Bridge with green band 2x10 Supine: TA activation with opp arm/leg push into purple ball [employer]: 10# kettlebell x1 lap each Standing on balance pad: around the [place] with 5# kettlebell x10 each    05/29/24 NuStep: Level 5x 8 minutes-PT present to discuss progress  Seated piriformis stretch 3x20 seconds  Seated hamstring stretch 3x20 seconds bil  Sit to stand: 5# kettlebell: neutral stance and staggered stance 2x10 each  Bridge with green band 2x10 Supine: TA activation with opp arm/leg push into purple ball [employer] Car[employer]: 10# kettlebell x1 lap each Standing on balance pad: around the [place] with 5# kettlebell x10 each   05/11/24 NuStep: Level 5x 8 minutes-PT present to discuss progress  Seated piriformis stretch 3x20 seconds  Seated hamstring stretch 3x20 seconds  bil  Sit to stand: 5# kettlebell: neutral stance and staggered stance x10 each  Farmer[employer] Carry: 10# kettlebell x1 lap each Standing on balance pad: around the  world with 5# kettlebell x10 each Trigger Point Dry Needling  Subsequent Treatment: Instructions provided previously at initial dry needling treatment.   Patient Verbal Consent Given: Yes Education Handout Provided: Previously Provided Muscles Treated: Rt gluteals, piriformis and lumbar multifidi Electrical Stimulation Performed: No Treatment Response/Outcome: Utilized skilled palpation to identify bony landmarks and trigger points.  Able to illicit twitch response and muscle elongation.  Soft tissue mobilization to R gluteals following DN to further promote tissue elongation and decreased pain.      PATIENT EDUCATION:  Education details: Access Code: [email or web address], dry needling info (04/17/24) Person educated: Patient Education method: Explanation, Demonstration, and Handouts Education comprehension: verbalized understanding and returned demonstration  HOME EXERCISE PROGRAM: Access Code: [email or web address] URL: [email or web address] Date: 04/26/2024 Prepared by: Concha Deed  Exercises - Active Straight Leg Raise with Quad Set  - 1 x daily - 7 x weekly - 1-2 sets - 10 reps - Seated Sciatic Tensioner  - 5 x daily - 7 x weekly - 1 sets - 10 reps - Seated Figure 4 Piriformis Stretch  - 3 x daily - 7 x weekly - 1 sets - 3 reps - 20 hold - Seated Hamstring Stretch  - 3 x daily - 7 x weekly - 1 sets - 3 reps - 20 hold - Seated Transversus Abdominis Bracing  - 1 x daily - 7 x weekly - 3 sets - 10 reps - Clamshell  - 2 x daily - 3 x weekly - 3 sets - 10 reps - Sit to Stand Without Arm Support  - 2 x daily - 7 x weekly - 2 sets - 10 reps - Supine Lower Trunk Rotation  - 3 x daily - 7 x weekly - 1 sets - 3 reps - 20 hold - Quadricep Stretch with Chair and Counter Support  - 1 x daily - 7 x weekly - 1 sets - 3 reps - 30 sec hold - Lying Prone   - 1 x daily - 7 x weekly - 1 sets - 1 reps - 2 min hold - Static Prone on Elbows  - 1 x daily - 7 x weekly - 1 sets - 1 reps - 2 min hold - 90/90 SI Joint Self-Correction with Dowel  - 1-2 x daily - 7 x weekly - 1 sets - 3 reps - 5 sec hold  ASSESSMENT:  CLINICAL IMPRESSION: Pt has returned to her regular walking routine since returning from her trip. No Rt LE pain today, just stiffness in the morning.  She is no longer having pain that limits her sleep.  Modified Oswestry is improved from 22% to 14% disability this week. She is going to exercise on her own until next appt on 06/19/24.  PT monitored throughout session for technique and pain. Patient will benefit from skilled PT to address the below impairments and improve overall function.     OBJECTIVE IMPAIRMENTS: decreased activity tolerance, decreased mobility, decreased strength, increased muscle spasms, impaired flexibility, improper body mechanics, postural dysfunction, and pain.   ACTIVITY LIMITATIONS: carrying, lifting, standing, sleeping, stairs, bed mobility, and locomotion level  PARTICIPATION LIMITATIONS: cleaning, laundry, and community activity  PERSONAL FACTORS: Time since onset of injury/illness/exacerbation and 1 comorbidity: lumbar DDD/stenosis  are also affecting patient's functional outcome.   REHAB POTENTIAL: Good  CLINICAL DECISION MAKING: Stable/uncomplicated  EVALUATION COMPLEXITY: Low   GOALS: Goals reviewed with patient? Yes  SHORT TERM GOALS: Target date: 05/10/2024    Be independent in initial HEP Baseline: Goal status: MET  2.  Report > or = to 25% fewer sleep interruptions due to LBP/Rt LE pain Baseline: 50% (05/02/24) Goal status: MET  3.  Negotiate steps with > or = to 30% reduced Rt LE pain Baseline: 40-50% 05/02/24 Goal status: MET  4.  Verbalize and demonstrate body mechanics modifications for lumbar protection with daily tasks  Baseline:  Goal status: INITIAL   LONG TERM GOALS: Target  date: 07/13/2024      Be independent in advanced HEP Baseline:  Goal status: In progress   2.  Report > or = to 75% fewer sleep interruptions due to LBP/Rt LE pain Baseline: no sleep interruptions (05/29/24) Goal status: MET  3.  Improve Modified Oswestry to < or = to 10% disability  Baseline: 14% disability (05/29/24) Goal status:in progress   4.  Negotiate steps with > or = to 70% reduced Rt LE pain Baseline: 50% (06/01/24) Goal status: In progress  PLAN:  PT FREQUENCY: 2x/week  PT DURATION: 6 weeks  PLANNED INTERVENTIONS: 97110-Therapeutic exercises, 97530- Therapeutic activity, [id number]- Neuromuscular re-education, 97535- Self Care, 13086- Manual therapy, [phone]- Gait training, [phone]- Canalith repositioning, [id number]- Aquatic Therapy, [phone]- Electrical stimulation (unattended), [phone]- Electrical stimulation (manual), Patient/Family education, Taping, Dry Needling, Joint mobilization, Joint manipulation, Spinal manipulation, Spinal mobilization, Cryotherapy, and Moist heat.  PLAN FOR NEXT SESSION: dry needling again if needed, Rt hip and gluteal strength, core stability, hip and lumbar flexibility.  Pt will return 06/19/24 and will exercise independently until that time.   Luella Sager, PT 06/01/24 12:34 PM  [facility] [address], [address] [place], [place] [address] Phone # [phone] Fax [phone]

## 2024-06-05 ENCOUNTER — Ambulatory Visit

## 2024-06-19 ENCOUNTER — Ambulatory Visit

## 2024-12-08 ENCOUNTER — Ambulatory Visit: Admit: 2024-12-08 | Payer: Medicare (Managed Care) | Attending: Adult Health | Primary: Adult Health

## 2024-12-08 ENCOUNTER — Inpatient Hospital Stay: Admit: 2024-12-08 | Discharge: 2024-12-08 | Payer: Medicare (Managed Care)

## 2024-12-08 DIAGNOSIS — R928 Other abnormal and inconclusive findings on diagnostic imaging of breast: Principal | ICD-10-CM

## 2024-12-08 DIAGNOSIS — C50411 Malignant neoplasm of upper-outer quadrant of right female breast: Principal | ICD-10-CM

## 2024-12-08 DIAGNOSIS — Z171 Estrogen receptor negative status [ER-]: Principal | ICD-10-CM

## 2024-12-15 ENCOUNTER — Inpatient Hospital Stay: Admit: 2024-12-15 | Discharge: 2024-12-15 | Payer: Medicare (Managed Care)

## 2024-12-18 DIAGNOSIS — C50411 Malignant neoplasm of upper-outer quadrant of right female breast: Principal | ICD-10-CM

## 2024-12-18 DIAGNOSIS — Z171 Estrogen receptor negative status [ER-]: Principal | ICD-10-CM

## 2024-12-18 DIAGNOSIS — Z1231 Encounter for screening mammogram for malignant neoplasm of breast: Principal | ICD-10-CM
